# Patient Record
Sex: Female | Born: 1935 | Race: White | Hispanic: No | Marital: Single | Smoking: Former smoker
Health system: Southern US, Community
[De-identification: ages and names within clinical notes are randomized; demographics above are authoritative.]

## PROBLEM LIST (undated history)

## (undated) DIAGNOSIS — K219 Gastro-esophageal reflux disease without esophagitis: Secondary | ICD-10-CM

## (undated) DIAGNOSIS — E7800 Pure hypercholesterolemia, unspecified: Secondary | ICD-10-CM

## (undated) DIAGNOSIS — N289 Disorder of kidney and ureter, unspecified: Secondary | ICD-10-CM

---

## 2006-04-10 ENCOUNTER — Emergency Department: Payer: Self-pay | Admitting: Unknown Physician Specialty

## 2006-08-16 ENCOUNTER — Emergency Department: Payer: Self-pay | Admitting: Emergency Medicine

## 2008-07-07 ENCOUNTER — Emergency Department: Payer: Self-pay | Admitting: Emergency Medicine

## 2008-07-10 ENCOUNTER — Inpatient Hospital Stay: Payer: Self-pay | Admitting: Orthopedic Surgery

## 2008-07-13 ENCOUNTER — Encounter: Payer: Self-pay | Admitting: Internal Medicine

## 2008-07-18 ENCOUNTER — Encounter: Payer: Self-pay | Admitting: Internal Medicine

## 2008-07-24 ENCOUNTER — Emergency Department: Payer: Self-pay | Admitting: Internal Medicine

## 2008-08-04 ENCOUNTER — Emergency Department: Payer: Self-pay | Admitting: Emergency Medicine

## 2008-08-18 ENCOUNTER — Encounter: Payer: Self-pay | Admitting: Internal Medicine

## 2008-09-01 ENCOUNTER — Ambulatory Visit: Payer: Self-pay | Admitting: Internal Medicine

## 2008-09-11 ENCOUNTER — Encounter: Payer: Self-pay | Admitting: Internal Medicine

## 2008-10-12 ENCOUNTER — Encounter: Payer: Self-pay | Admitting: Internal Medicine

## 2008-12-12 ENCOUNTER — Emergency Department: Payer: Self-pay | Admitting: Emergency Medicine

## 2008-12-19 ENCOUNTER — Inpatient Hospital Stay: Payer: Self-pay | Admitting: Internal Medicine

## 2010-12-26 ENCOUNTER — Emergency Department: Payer: Self-pay | Admitting: Unknown Physician Specialty

## 2011-03-28 ENCOUNTER — Emergency Department: Payer: Self-pay | Admitting: Emergency Medicine

## 2011-07-04 ENCOUNTER — Emergency Department: Payer: Self-pay | Admitting: *Deleted

## 2014-11-06 ENCOUNTER — Inpatient Hospital Stay (HOSPITAL_COMMUNITY): Payer: Medicare Other

## 2014-11-06 ENCOUNTER — Emergency Department (HOSPITAL_COMMUNITY): Payer: Medicare Other

## 2014-11-06 ENCOUNTER — Inpatient Hospital Stay (HOSPITAL_COMMUNITY)
Admission: EM | Admit: 2014-11-06 | Discharge: 2014-11-11 | DRG: 640 | Disposition: A | Discharge status: Skilled Nursing Facility | Payer: Medicare Other | Attending: Internal Medicine | Admitting: Internal Medicine

## 2014-11-06 ENCOUNTER — Encounter (HOSPITAL_COMMUNITY): Payer: Self-pay | Admitting: Cardiology

## 2014-11-06 DIAGNOSIS — Z66 Do not resuscitate: Secondary | ICD-10-CM | POA: Diagnosis present

## 2014-11-06 DIAGNOSIS — N2581 Secondary hyperparathyroidism of renal origin: Secondary | ICD-10-CM | POA: Diagnosis present

## 2014-11-06 DIAGNOSIS — R001 Bradycardia, unspecified: Secondary | ICD-10-CM | POA: Diagnosis not present

## 2014-11-06 DIAGNOSIS — D890 Polyclonal hypergammaglobulinemia: Secondary | ICD-10-CM | POA: Diagnosis present

## 2014-11-06 DIAGNOSIS — G309 Alzheimer's disease, unspecified: Secondary | ICD-10-CM | POA: Diagnosis present

## 2014-11-06 DIAGNOSIS — R531 Weakness: Secondary | ICD-10-CM | POA: Diagnosis present

## 2014-11-06 DIAGNOSIS — Z87891 Personal history of nicotine dependence: Secondary | ICD-10-CM | POA: Diagnosis not present

## 2014-11-06 DIAGNOSIS — B9620 Unspecified Escherichia coli [E. coli] as the cause of diseases classified elsewhere: Secondary | ICD-10-CM | POA: Diagnosis present

## 2014-11-06 DIAGNOSIS — E860 Dehydration: Secondary | ICD-10-CM | POA: Diagnosis present

## 2014-11-06 DIAGNOSIS — R1310 Dysphagia, unspecified: Secondary | ICD-10-CM | POA: Diagnosis present

## 2014-11-06 DIAGNOSIS — Z7982 Long term (current) use of aspirin: Secondary | ICD-10-CM

## 2014-11-06 DIAGNOSIS — R109 Unspecified abdominal pain: Secondary | ICD-10-CM

## 2014-11-06 DIAGNOSIS — F0280 Dementia in other diseases classified elsewhere without behavioral disturbance: Secondary | ICD-10-CM | POA: Diagnosis present

## 2014-11-06 DIAGNOSIS — N179 Acute kidney failure, unspecified: Secondary | ICD-10-CM | POA: Diagnosis present

## 2014-11-06 DIAGNOSIS — G9341 Metabolic encephalopathy: Secondary | ICD-10-CM | POA: Diagnosis present

## 2014-11-06 DIAGNOSIS — R7989 Other specified abnormal findings of blood chemistry: Secondary | ICD-10-CM

## 2014-11-06 DIAGNOSIS — R29810 Facial weakness: Secondary | ICD-10-CM | POA: Diagnosis present

## 2014-11-06 DIAGNOSIS — N390 Urinary tract infection, site not specified: Secondary | ICD-10-CM | POA: Diagnosis present

## 2014-11-06 DIAGNOSIS — E876 Hypokalemia: Secondary | ICD-10-CM | POA: Diagnosis not present

## 2014-11-06 DIAGNOSIS — N183 Chronic kidney disease, stage 3 unspecified: Secondary | ICD-10-CM | POA: Diagnosis present

## 2014-11-06 DIAGNOSIS — M8580 Other specified disorders of bone density and structure, unspecified site: Secondary | ICD-10-CM | POA: Diagnosis present

## 2014-11-06 DIAGNOSIS — R7401 Elevation of levels of liver transaminase levels: Secondary | ICD-10-CM | POA: Diagnosis present

## 2014-11-06 DIAGNOSIS — R7303 Prediabetes: Secondary | ICD-10-CM | POA: Diagnosis present

## 2014-11-06 DIAGNOSIS — E861 Hypovolemia: Secondary | ICD-10-CM | POA: Diagnosis present

## 2014-11-06 DIAGNOSIS — H9193 Unspecified hearing loss, bilateral: Secondary | ICD-10-CM | POA: Diagnosis present

## 2014-11-06 DIAGNOSIS — I4891 Unspecified atrial fibrillation: Secondary | ICD-10-CM | POA: Diagnosis not present

## 2014-11-06 DIAGNOSIS — E785 Hyperlipidemia, unspecified: Secondary | ICD-10-CM | POA: Diagnosis present

## 2014-11-06 DIAGNOSIS — I5189 Other ill-defined heart diseases: Secondary | ICD-10-CM | POA: Diagnosis present

## 2014-11-06 DIAGNOSIS — G9340 Encephalopathy, unspecified: Secondary | ICD-10-CM | POA: Diagnosis not present

## 2014-11-06 DIAGNOSIS — I519 Heart disease, unspecified: Secondary | ICD-10-CM | POA: Diagnosis not present

## 2014-11-06 DIAGNOSIS — E780 Pure hypercholesterolemia: Secondary | ICD-10-CM | POA: Diagnosis present

## 2014-11-06 DIAGNOSIS — J309 Allergic rhinitis, unspecified: Secondary | ICD-10-CM | POA: Diagnosis present

## 2014-11-06 DIAGNOSIS — I440 Atrioventricular block, first degree: Secondary | ICD-10-CM | POA: Diagnosis present

## 2014-11-06 DIAGNOSIS — E0781 Sick-euthyroid syndrome: Secondary | ICD-10-CM | POA: Diagnosis present

## 2014-11-06 DIAGNOSIS — E870 Hyperosmolality and hypernatremia: Principal | ICD-10-CM

## 2014-11-06 DIAGNOSIS — F0390 Unspecified dementia without behavioral disturbance: Secondary | ICD-10-CM | POA: Diagnosis present

## 2014-11-06 DIAGNOSIS — R4182 Altered mental status, unspecified: Secondary | ICD-10-CM | POA: Diagnosis not present

## 2014-11-06 DIAGNOSIS — R740 Nonspecific elevation of levels of transaminase and lactic acid dehydrogenase [LDH]: Secondary | ICD-10-CM

## 2014-11-06 DIAGNOSIS — I480 Paroxysmal atrial fibrillation: Secondary | ICD-10-CM | POA: Diagnosis present

## 2014-11-06 DIAGNOSIS — E0590 Thyrotoxicosis, unspecified without thyrotoxic crisis or storm: Secondary | ICD-10-CM | POA: Diagnosis present

## 2014-11-06 DIAGNOSIS — K219 Gastro-esophageal reflux disease without esophagitis: Secondary | ICD-10-CM | POA: Diagnosis present

## 2014-11-06 DIAGNOSIS — B9689 Other specified bacterial agents as the cause of diseases classified elsewhere: Secondary | ICD-10-CM | POA: Diagnosis not present

## 2014-11-06 HISTORY — DX: Gastro-esophageal reflux disease without esophagitis: K21.9

## 2014-11-06 HISTORY — DX: Disorder of kidney and ureter, unspecified: N28.9

## 2014-11-06 HISTORY — DX: Pure hypercholesterolemia, unspecified: E78.00

## 2014-11-06 LAB — LIPID PANEL
Cholesterol: 147 mg/dL (ref 0–200)
HDL: 30 mg/dL — ABNORMAL LOW (ref >39)
LDL CALC: 96 mg/dL (ref 0–99)
TRIGLYCERIDES: 104 mg/dL (ref <150)
Total CHOL/HDL Ratio: 4.9 RATIO
VLDL: 21 mg/dL (ref 0–40)

## 2014-11-06 LAB — URINALYSIS, ROUTINE W REFLEX MICROSCOPIC
Bilirubin Urine: NEGATIVE
Glucose, UA: NEGATIVE mg/dL
KETONES UR: NEGATIVE mg/dL
NITRITE: POSITIVE — AB
Protein, ur: 30 mg/dL — AB
SPECIFIC GRAVITY, URINE: 1.013 (ref 1.005–1.030)
Urobilinogen, UA: 0.2 mg/dL (ref 0.0–1.0)
pH: 5.5 (ref 5.0–8.0)

## 2014-11-06 LAB — MAGNESIUM: Magnesium: 3 mg/dL — ABNORMAL HIGH (ref 1.5–2.5)

## 2014-11-06 LAB — I-STAT CHEM 8, ED
BUN: 57 mg/dL — ABNORMAL HIGH (ref 6–23)
CALCIUM ION: 1.28 mmol/L (ref 1.13–1.30)
Chloride: 131 mmol/L (ref 96–112)
Creatinine, Ser: 1.8 mg/dL — ABNORMAL HIGH (ref 0.50–1.10)
Glucose, Bld: 112 mg/dL — ABNORMAL HIGH (ref 70–99)
HEMATOCRIT: 49 % — AB (ref 36.0–46.0)
Hemoglobin: 16.7 g/dL — ABNORMAL HIGH (ref 12.0–15.0)
Potassium: 3.4 mmol/L — ABNORMAL LOW (ref 3.5–5.1)
Sodium: 177 mmol/L (ref 135–145)
TCO2: 26 mmol/L (ref 0–100)

## 2014-11-06 LAB — DIFFERENTIAL
BASOS PCT: 0 % (ref 0–1)
Basophils Absolute: 0 10*3/uL (ref 0.0–0.1)
Eosinophils Absolute: 0 10*3/uL (ref 0.0–0.7)
Eosinophils Relative: 0 % (ref 0–5)
LYMPHS PCT: 12 % (ref 12–46)
Lymphs Abs: 1.9 10*3/uL (ref 0.7–4.0)
Monocytes Absolute: 1.2 10*3/uL — ABNORMAL HIGH (ref 0.1–1.0)
Monocytes Relative: 8 % (ref 3–12)
Neutro Abs: 12.5 10*3/uL — ABNORMAL HIGH (ref 1.7–7.7)
Neutrophils Relative %: 80 % — ABNORMAL HIGH (ref 43–77)

## 2014-11-06 LAB — TECHNOLOGIST SMEAR REVIEW

## 2014-11-06 LAB — CBG MONITORING, ED: Glucose-Capillary: 116 mg/dL — ABNORMAL HIGH (ref 70–99)

## 2014-11-06 LAB — COMPREHENSIVE METABOLIC PANEL
ALT: 31 U/L (ref 0–35)
AST: 40 U/L — ABNORMAL HIGH (ref 0–37)
Albumin: 3.3 g/dL — ABNORMAL LOW (ref 3.5–5.2)
Alkaline Phosphatase: 70 U/L (ref 39–117)
BILIRUBIN TOTAL: 1.1 mg/dL (ref 0.3–1.2)
BUN: 59 mg/dL — AB (ref 6–23)
CO2: 29 mmol/L (ref 19–32)
Calcium: 10.4 mg/dL (ref 8.4–10.5)
Chloride: >130 mmol/L (ref 96–112)
Creatinine, Ser: 1.91 mg/dL — ABNORMAL HIGH (ref 0.50–1.10)
GFR calc Af Amer: 26 mL/min — ABNORMAL LOW (ref >90)
GFR calc non Af Amer: 23 mL/min — ABNORMAL LOW (ref >90)
Glucose, Bld: 116 mg/dL — ABNORMAL HIGH (ref 70–99)
Potassium: 3.4 mmol/L — ABNORMAL LOW (ref 3.5–5.1)
Sodium: 174 mmol/L (ref 135–145)
TOTAL PROTEIN: 7.8 g/dL (ref 6.0–8.3)

## 2014-11-06 LAB — HEPATITIS PANEL, ACUTE
HCV Ab: NEGATIVE
HEP A IGM: NONREACTIVE
HEP B S AG: NEGATIVE
Hep B C IgM: NONREACTIVE

## 2014-11-06 LAB — BASIC METABOLIC PANEL
BUN: 57 mg/dL — AB (ref 6–23)
CO2: 28 mmol/L (ref 19–32)
Calcium: 9.8 mg/dL (ref 8.4–10.5)
Chloride: >130 mmol/L (ref 96–112)
Creatinine, Ser: 1.67 mg/dL — ABNORMAL HIGH (ref 0.50–1.10)
GFR calc Af Amer: 31 mL/min — ABNORMAL LOW (ref >90)
GFR calc non Af Amer: 27 mL/min — ABNORMAL LOW (ref >90)
Glucose, Bld: 116 mg/dL — ABNORMAL HIGH (ref 70–99)
Potassium: 3.3 mmol/L — ABNORMAL LOW (ref 3.5–5.1)
Sodium: 173 mmol/L (ref 135–145)

## 2014-11-06 LAB — RAPID URINE DRUG SCREEN, HOSP PERFORMED
Amphetamines: NOT DETECTED
Barbiturates: NOT DETECTED
Benzodiazepines: NOT DETECTED
COCAINE: NOT DETECTED
OPIATES: POSITIVE — AB
TETRAHYDROCANNABINOL: NOT DETECTED

## 2014-11-06 LAB — APTT: aPTT: 39 seconds — ABNORMAL HIGH (ref 24–37)

## 2014-11-06 LAB — SODIUM, URINE, RANDOM: SODIUM UR: 47 mmol/L

## 2014-11-06 LAB — CBC
HCT: 49.8 % — ABNORMAL HIGH (ref 36.0–46.0)
Hemoglobin: 15.3 g/dL — ABNORMAL HIGH (ref 12.0–15.0)
MCH: 32.5 pg (ref 26.0–34.0)
MCHC: 30.7 g/dL (ref 30.0–36.0)
MCV: 105.7 fL — AB (ref 78.0–100.0)
PLATELETS: 224 10*3/uL (ref 150–400)
RBC: 4.71 MIL/uL (ref 3.87–5.11)
RDW: 13.8 % (ref 11.5–15.5)
WBC: 15.6 10*3/uL — ABNORMAL HIGH (ref 4.0–10.5)

## 2014-11-06 LAB — TSH: TSH: 0.168 u[IU]/mL — AB (ref 0.350–4.500)

## 2014-11-06 LAB — URINE MICROSCOPIC-ADD ON

## 2014-11-06 LAB — PROTIME-INR
INR: 1.18 (ref 0.00–1.49)
Prothrombin Time: 15.1 seconds (ref 11.6–15.2)

## 2014-11-06 LAB — TROPONIN I
Troponin I: 0.06 ng/mL — ABNORMAL HIGH (ref <0.031)
Troponin I: 0.07 ng/mL — ABNORMAL HIGH (ref <0.031)

## 2014-11-06 LAB — I-STAT TROPONIN, ED: Troponin i, poc: 0.04 ng/mL (ref 0.00–0.08)

## 2014-11-06 LAB — CREATININE, URINE, RANDOM: CREATININE, URINE: 79.13 mg/dL

## 2014-11-06 LAB — ETHANOL

## 2014-11-06 LAB — OSMOLALITY, URINE: Osmolality, Ur: 460 mOsm/kg (ref 390–1090)

## 2014-11-06 LAB — MRSA PCR SCREENING: MRSA by PCR: NEGATIVE

## 2014-11-06 LAB — PHOSPHORUS: Phosphorus: 2.8 mg/dL (ref 2.3–4.6)

## 2014-11-06 LAB — CK: CK TOTAL: 55 U/L (ref 7–177)

## 2014-11-06 MED ORDER — SODIUM CHLORIDE 0.9 % IV SOLN: INTRAVENOUS | Status: DC

## 2014-11-06 MED ORDER — DEXTROSE 5 % IV SOLN
1.0000 g | Freq: Once | INTRAVENOUS | Status: AC
  Administered 2014-11-06: 1 g via INTRAVENOUS
  Filled 2014-11-06: qty 10

## 2014-11-06 MED ORDER — HEPARIN BOLUS VIA INFUSION
3000.0000 [IU] | Freq: Once | INTRAVENOUS | Status: AC
  Administered 2014-11-06: 3000 [IU] via INTRAVENOUS
  Filled 2014-11-06: qty 3000

## 2014-11-06 MED ORDER — SODIUM CHLORIDE 0.9 % IJ SOLN
3.0000 mL | Freq: Two times a day (BID) | INTRAMUSCULAR | Status: DC
  Administered 2014-11-07 – 2014-11-10 (×5): 3 mL via INTRAVENOUS

## 2014-11-06 MED ORDER — HEPARIN (PORCINE) IN NACL 100-0.45 UNIT/ML-% IJ SOLN
650.0000 [IU]/h | INTRAMUSCULAR | Status: DC
  Administered 2014-11-06: 650 [IU]/h via INTRAVENOUS
  Filled 2014-11-06: qty 250

## 2014-11-06 MED ORDER — ONDANSETRON HCL 4 MG/2ML IJ SOLN: 4.0000 mg | Freq: Four times a day (QID) | INTRAMUSCULAR | Status: DC | PRN

## 2014-11-06 MED ORDER — DEXTROSE 5 % IV SOLN
1.0000 g | INTRAVENOUS | Status: DC
  Administered 2014-11-07 – 2014-11-08 (×2): 1 g via INTRAVENOUS
  Filled 2014-11-06 (×3): qty 10

## 2014-11-06 MED ORDER — POTASSIUM CHLORIDE 10 MEQ/100ML IV SOLN
10.0000 meq | INTRAVENOUS | Status: AC
  Administered 2014-11-06: 10 meq via INTRAVENOUS
  Filled 2014-11-06: qty 100

## 2014-11-06 MED ORDER — ONDANSETRON HCL 4 MG PO TABS: 4.0000 mg | ORAL_TABLET | Freq: Four times a day (QID) | ORAL | Status: DC | PRN

## 2014-11-06 MED ORDER — CETYLPYRIDINIUM CHLORIDE 0.05 % MT LIQD
7.0000 mL | Freq: Two times a day (BID) | OROMUCOSAL | Status: DC
  Administered 2014-11-06: 7 mL via OROMUCOSAL

## 2014-11-06 MED ORDER — DILTIAZEM HCL 100 MG IV SOLR: 5.0000 mg/h | Freq: Once | INTRAVENOUS | Status: DC

## 2014-11-06 MED ORDER — POTASSIUM CHLORIDE 10 MEQ/100ML IV SOLN
10.0000 meq | INTRAVENOUS | Status: AC
  Administered 2014-11-06 (×2): 10 meq via INTRAVENOUS
  Filled 2014-11-06 (×2): qty 100

## 2014-11-06 NOTE — ED Notes (Signed)
Pt to department via EMS from home place of [place]. Pt was awakened and taken by staff to the dining room for breakfast, pt was unable to swallow her food. Pt's baseline is normally confused but verbal. Today she is not talking and not acting like herself.  LSN- unknown. CBG-116 Bp-158 Hr-70 20g LAC.

## 2014-11-06 NOTE — Consult Note (Signed)
Admission H&P    Chief Complaint: Altered mental status.  HPI: Molly Jimenez is an 81 y.o. female with a history of renal disorder, atrial fibrillation not on anticoagulation, GERD, hypercholesterolemia and dementia, brought to the emergency room for evaluation of altered mental status. Patient reportedly has become progressively more confused over the past 1 week. She was noted today to be staring and not responding to the caretaker that was feeding her. There was also an equivocal right facial droop. CT scan of her head showed progressive brain atrophy and chronic small vessel disease, but no acute changes. She had a low-grade fever of 99.1. Serum sodium was elevated at 177, BUN was 57 and creatinine was 1.8. Urinalysis showed findings indicative of acute urinary tract infection. WBC count was 15.6. Neurology consultation was obtained because of altered mental status and possible acute stroke in the setting of atrial fibrillation. Anticoagulation with IV heparin was started.  Past Medical History  Diagnosis Date  . Renal disorder   . GERD (gastroesophageal reflux disease)   . Hypercholesteremia     History reviewed. No pertinent past surgical history.  Family history: Unavailable due to patient's mental status.  Social History:  reports that she has quit smoking. She does not have any smokeless tobacco history on file. She reports that she does not drink alcohol. Her drug history is not on file.  Allergies: No Known Allergies  Medications: Patient's preadmission medications were reviewed by me.  ROS: Unobtainable due to patient's mental status.  Physical Examination: Blood pressure 113/64, pulse 36, temperature 99.1 F (37.3 C), temperature source Rectal, resp. rate 19, SpO2 94 %.  HEENT-  Normocephalic, no lesions, without obvious abnormality.  Normal external eye and conjunctiva.  Normal TM's bilaterally.  Normal auditory canals and external ears. Normal external nose, mucus  membranes and septum.  Normal pharynx. Neck supple with no masses, nodes, nodules or enlargement. Cardiovascular - regular rate and rhythm, S1, S2 normal, no murmur, click, rub or gallop Lungs - chest clear, no wheezing, rales, normal symmetric air entry, Heart exam - S1, S2 normal, no murmur, no gallop, rate regular Abdomen - soft, non-tender; bowel sounds normal; no masses,  no organomegaly Extremities - no joint deformities, effusion, or inflammation and no edema  Neurologic Examination: Patient appeared to be alert but did not respond verbally and did not follow simple commands. She was able to visually track to both sides without difficulty. Pupils were equal and reacted normally to light. Extraocular movements were full and conjugate with right left lateral gaze. Mild facial asymmetry noted with slight right lower facial droop. Patient had no speech output. Muscle tone was increased throughout, right greater than left. Resting of right upper extremity and shoulder was noted. Tendon reflexes are asymmetric with increased responses elicited from left extremities compared to right. Plantar response on the right was mute and on the left.   Results for orders placed or performed during the hospital encounter of 11/06/14 (from the past 48 hour(s))  Urine Drug Screen     Status: Abnormal   Collection Time: 11/06/14 10:54 AM  Result Value Ref Range   Opiates POSITIVE (A) NONE DETECTED   Cocaine NONE DETECTED NONE DETECTED   Benzodiazepines NONE DETECTED NONE DETECTED   Amphetamines NONE DETECTED NONE DETECTED   Tetrahydrocannabinol NONE DETECTED NONE DETECTED   Barbiturates NONE DETECTED NONE DETECTED    Comment:        DRUG SCREEN FOR MEDICAL PURPOSES ONLY.  IF CONFIRMATION IS NEEDED FOR  ANY PURPOSE, NOTIFY LAB WITHIN 5 DAYS.        LOWEST DETECTABLE LIMITS FOR URINE DRUG SCREEN Drug Class       Cutoff (ng/mL) Amphetamine      1000 Barbiturate      200 Benzodiazepine    315 Tricyclics       176 Opiates          300 Cocaine          300 THC              50   Urinalysis, Routine w reflex microscopic     Status: Abnormal   Collection Time: 11/06/14 10:54 AM  Result Value Ref Range   Color, Urine YELLOW YELLOW   APPearance CLOUDY (A) CLEAR   Specific Gravity, Urine 1.013 1.005 - 1.030   pH 5.5 5.0 - 8.0   Glucose, UA NEGATIVE NEGATIVE mg/dL   Hgb urine dipstick SMALL (A) NEGATIVE   Bilirubin Urine NEGATIVE NEGATIVE   Ketones, ur NEGATIVE NEGATIVE mg/dL   Protein, ur 30 (A) NEGATIVE mg/dL   Urobilinogen, UA 0.2 0.0 - 1.0 mg/dL   Nitrite POSITIVE (A) NEGATIVE   Leukocytes, UA LARGE (A) NEGATIVE  Urine microscopic-add on     Status: Abnormal   Collection Time: 11/06/14 10:54 AM  Result Value Ref Range   WBC, UA 11-20 <3 WBC/hpf   RBC / HPF 0-2 <3 RBC/hpf   Bacteria, UA MANY (A) RARE  Sodium, urine, random     Status: None   Collection Time: 11/06/14 10:54 AM  Result Value Ref Range   Sodium, Ur 47 mmol/L  Creatinine, urine, random     Status: None   Collection Time: 11/06/14 10:54 AM  Result Value Ref Range   Creatinine, Urine 79.13 mg/dL  CBG monitoring, ED     Status: Abnormal   Collection Time: 11/06/14 11:00 AM  Result Value Ref Range   Glucose-Capillary 116 (H) 70 - 99 mg/dL  Ethanol     Status: None   Collection Time: 11/06/14 11:02 AM  Result Value Ref Range   Alcohol, Ethyl (B) <5 0 - 9 mg/dL    Comment:        LOWEST DETECTABLE LIMIT FOR SERUM ALCOHOL IS 11 mg/dL FOR MEDICAL PURPOSES ONLY   Protime-INR     Status: None   Collection Time: 11/06/14 11:02 AM  Result Value Ref Range   Prothrombin Time 15.1 11.6 - 15.2 seconds   INR 1.18 0.00 - 1.49  APTT     Status: Abnormal   Collection Time: 11/06/14 11:02 AM  Result Value Ref Range   aPTT 39 (H) 24 - 37 seconds    Comment:        IF BASELINE aPTT IS ELEVATED, SUGGEST PATIENT RISK ASSESSMENT BE USED TO DETERMINE APPROPRIATE ANTICOAGULANT THERAPY.   CBC     Status:  Abnormal   Collection Time: 11/06/14 11:02 AM  Result Value Ref Range   WBC 15.6 (H) 4.0 - 10.5 K/uL   RBC 4.71 3.87 - 5.11 MIL/uL   Hemoglobin 15.3 (H) 12.0 - 15.0 g/dL   HCT 49.8 (H) 36.0 - 46.0 %   MCV 105.7 (H) 78.0 - 100.0 fL   MCH 32.5 26.0 - 34.0 pg   MCHC 30.7 30.0 - 36.0 g/dL   RDW 13.8 11.5 - 15.5 %   Platelets 224 150 - 400 K/uL  Differential     Status: Abnormal   Collection Time: 11/06/14 11:02 AM  Result Value  Ref Range   Neutrophils Relative % 80 (H) 43 - 77 %   Neutro Abs 12.5 (H) 1.7 - 7.7 K/uL   Lymphocytes Relative 12 12 - 46 %   Lymphs Abs 1.9 0.7 - 4.0 K/uL   Monocytes Relative 8 3 - 12 %   Monocytes Absolute 1.2 (H) 0.1 - 1.0 K/uL   Eosinophils Relative 0 0 - 5 %   Eosinophils Absolute 0.0 0.0 - 0.7 K/uL   Basophils Relative 0 0 - 1 %   Basophils Absolute 0.0 0.0 - 0.1 K/uL  Comprehensive metabolic panel     Status: Abnormal   Collection Time: 11/06/14 11:02 AM  Result Value Ref Range   Sodium 174 (HH) 135 - 145 mmol/L    Comment: REPEATED TO VERIFY CRITICAL RESULT CALLED TO, READ BACK BY AND VERIFIED WITH: L.ROBERTS,RN 11/06/14 1204 BY BSLADE    Potassium 3.4 (L) 3.5 - 5.1 mmol/L   Chloride >130 (HH) 96 - 112 mmol/L    Comment: REPEATED TO VERIFY CRITICAL RESULT CALLED TO, READ BACK BY AND VERIFIED WITH: L.ROBERTS,RN 11/06/14 1203 BY BSLADE    CO2 29 19 - 32 mmol/L   Glucose, Bld 116 (H) 70 - 99 mg/dL   BUN 59 (H) 6 - 23 mg/dL   Creatinine, Ser 1.91 (H) 0.50 - 1.10 mg/dL   Calcium 10.4 8.4 - 10.5 mg/dL   Total Protein 7.8 6.0 - 8.3 g/dL   Albumin 3.3 (L) 3.5 - 5.2 g/dL   AST 40 (H) 0 - 37 U/L   ALT 31 0 - 35 U/L   Alkaline Phosphatase 70 39 - 117 U/L   Total Bilirubin 1.1 0.3 - 1.2 mg/dL   GFR calc non Af Amer 23 (L) >90 mL/min   GFR calc Af Amer 26 (L) >90 mL/min    Comment: (NOTE) The eGFR has been calculated using the CKD EPI equation. This calculation has not been validated in all clinical situations. eGFR's persistently <90 mL/min  signify possible Chronic Kidney Disease.    Anion gap NOT CALCULATED 5 - 15  I-stat troponin, ED (not at [facility], [facility])     Status: None   Collection Time: 11/06/14 11:16 AM  Result Value Ref Range   Troponin i, poc 0.04 0.00 - 0.08 ng/mL   Comment 3            Comment: Due to the release kinetics of cTnI, a negative result within the first hours of the onset of symptoms does not rule out myocardial infarction with certainty. If myocardial infarction is still suspected, repeat the test at appropriate intervals.   I-Stat Chem 8, ED     Status: Abnormal   Collection Time: 11/06/14 11:18 AM  Result Value Ref Range   Sodium 177 (HH) 135 - 145 mmol/L   Potassium 3.4 (L) 3.5 - 5.1 mmol/L   Chloride 131 (HH) 96 - 112 mmol/L   BUN 57 (H) 6 - 23 mg/dL   Creatinine, Ser 1.80 (H) 0.50 - 1.10 mg/dL   Glucose, Bld 112 (H) 70 - 99 mg/dL   Calcium, Ion 1.28 1.13 - 1.30 mmol/L   TCO2 26 0 - 100 mmol/L   Hemoglobin 16.7 (H) 12.0 - 15.0 g/dL   HCT 49.0 (H) 36.0 - 46.0 %   Comment NOTIFIED PHYSICIAN    Ct Head Wo Contrast  11/06/2014   CLINICAL DATA:  Unable to eat. Right-sided neck pain when trying to straighten.  EXAM: CT HEAD WITHOUT CONTRAST  CT CERVICAL SPINE  WITHOUT CONTRAST  TECHNIQUE: Multidetector CT imaging of the head and cervical spine was performed following the standard protocol without intravenous contrast. Multiplanar CT image reconstructions of the cervical spine were also generated.  COMPARISON:  Head CT 07/04/2011  FINDINGS: CT HEAD FINDINGS  Skull and Sinuses:Negative for fracture or destructive process. The mastoids, middle ears, and imaged paranasal sinuses are clear.  Orbits: No acute abnormality.  Brain: No evidence of acute infarction, hemorrhage, hydrocephalus, or mass lesion/mass effect. Generalized brain atrophy with ventriculomegaly. Brain atrophy which is progressed from 2012. Ventriculomegaly is likely from central predominant volume loss. There is prominent mesial temporal  lobe atrophy, pattern which can be seen with Alzheimer's disease. Chronic small vessel disease ischemic gliosis throughout the bilateral cerebral white matter, also progressed from prior.  CT CERVICAL SPINE FINDINGS  No fracture or subluxation. No erosion of the endplates with facets. No gross cervical canal hematoma or prevertebral edema. The muscular or ligamentous ossification.  Profound osteopenia. There is degenerative disc disease which is focally advanced at C5-6. Despite endplate spurs node notable canal stenosis. Facet ankylosis bilaterally at C4-5 and C5-6. There is advanced degeneration at the C1-C2 articulation with bulky spurring about the atlanto dental interval.  Bilateral submandibular sialolithiasis.  No acute inflammation.  Aberrant right subclavian artery, partly visualized.  IMPRESSION: 1. No acute intracranial or cervical spine findings. 2. Progressive brain atrophy and chronic small vessel disease.   Electronically Signed   By: Monte Fantasia M.D.   On: 11/06/2014 12:01   Ct Cervical Spine Wo Contrast  11/06/2014   CLINICAL DATA:  Unable to eat. Right-sided neck pain when trying to straighten.  EXAM: CT HEAD WITHOUT CONTRAST  CT CERVICAL SPINE WITHOUT CONTRAST  TECHNIQUE: Multidetector CT imaging of the head and cervical spine was performed following the standard protocol without intravenous contrast. Multiplanar CT image reconstructions of the cervical spine were also generated.  COMPARISON:  Head CT 07/04/2011  FINDINGS: CT HEAD FINDINGS  Skull and Sinuses:Negative for fracture or destructive process. The mastoids, middle ears, and imaged paranasal sinuses are clear.  Orbits: No acute abnormality.  Brain: No evidence of acute infarction, hemorrhage, hydrocephalus, or mass lesion/mass effect. Generalized brain atrophy with ventriculomegaly. Brain atrophy which is progressed from 2012. Ventriculomegaly is likely from central predominant volume loss. There is prominent mesial temporal lobe  atrophy, pattern which can be seen with Alzheimer's disease. Chronic small vessel disease ischemic gliosis throughout the bilateral cerebral white matter, also progressed from prior.  CT CERVICAL SPINE FINDINGS  No fracture or subluxation. No erosion of the endplates with facets. No gross cervical canal hematoma or prevertebral edema. The muscular or ligamentous ossification.  Profound osteopenia. There is degenerative disc disease which is focally advanced at C5-6. Despite endplate spurs node notable canal stenosis. Facet ankylosis bilaterally at C4-5 and C5-6. There is advanced degeneration at the C1-C2 articulation with bulky spurring about the atlanto dental interval.  Bilateral submandibular sialolithiasis.  No acute inflammation.  Aberrant right subclavian artery, partly visualized.  IMPRESSION: 1. No acute intracranial or cervical spine findings. 2. Progressive brain atrophy and chronic small vessel disease.   Electronically Signed   By: Monte Fantasia M.D.   On: 11/06/2014 12:01   Dg Chest Portable 1 View  11/06/2014   CLINICAL DATA:  Weakness and dysphagia.  Left facial droop.  EXAM: PORTABLE CHEST - 1 VIEW  COMPARISON:  PA and lateral chest 07/04/2011 and 08/04/2008.  FINDINGS: The lungs are clear. Heart size is normal. No pneumothorax or pleural  effusion. Vertebral augmentation lower thoracic spine is again seen. Degenerative disease about the shoulders is worse on the right.  IMPRESSION: No acute disease.   Electronically Signed   By: Inge Rise M.D.   On: 11/06/2014 11:07    Assessment/Plan 81 year old lady presenting with encephalopathic state, most likely secondary to multiple factors, including underlying dementia, hypernatremia, dehydration, urinary tract infection and possible acute stroke.  Recommendations: 1. MRI of the brain to rule out acute stroke. 2. Stroke workup with risk assessment if MRI shows acute stroke, including hemoglobin A1c, fasting lipid panel, carotid Doppler,  2-D echocardiogram and MRA. 3. Agree with anticoagulation plans.  We will continue to follow this patient with you.  C.R. Nicole Kindred, [facility] [phone]  11/06/2014, 2:28 PM

## 2014-11-06 NOTE — ED Notes (Signed)
[facility] [place].  Sts pt is typically non-verbal except "no" or "don't do that".  Sts pt is typically able to stand and pivot in to a wheelchair for movement.

## 2014-11-06 NOTE — Progress Notes (Signed)
ANTICOAGULATION CONSULT NOTE - Initial Consult  Pharmacy Consult for Heparin Indication: atrial fibrillation  No Known Allergies  Patient Measurements:   Ht: 65 in   Wt: 53 kg  Vital Signs: Temp: 99.1 F (37.3 C) (04/25 1054) Temp Source: Rectal (04/25 1054) BP: 120/61 mmHg (04/25 1300) Pulse Rate: 71 (04/25 1300)  Labs:  Recent Labs  11/06/14 1102 11/06/14 1118  HGB 15.3* 16.7*  HCT 49.8* 49.0*  PLT 224  --   APTT 39*  --   LABPROT 15.1  --   INR 1.18  --   CREATININE 1.91* 1.80*    CrCl cannot be calculated (Unknown ideal weight.).   Medical History: Past Medical History  Diagnosis Date  . Renal disorder   . GERD (gastroesophageal reflux disease)   . Hypercholesteremia     Medications:  See electronic med rec  Assessment: 81 y.o. female presents with weakness and dysphagia. Found to be in afib. To begin heparin. CBC stable at baseline. SCr 1.8, est CrCl 18 ml/min.  Goal of Therapy:  Heparin level 0.3-0.7 units/ml Monitor platelets by anticoagulation protocol: Yes   Plan:  Heparin IV bolus 3000 units Heparin gtt at 650 units/hr Will f/u 8 hr heparin level Daily heparin level and CBC  Christoper Fabianaron Antonino Nienhuis, PharmD, BCPS Clinical pharmacist, pager [phone] 11/06/2014,1:24 PM

## 2014-11-06 NOTE — ED Provider Notes (Signed)
CSN: [id number]     Arrival date & time    History   First MD Initiated Contact with Patient 11/06/14 [phone]     Chief Complaint  Patient presents with  . Weakness  . Dysphagia   LEVEL 5 CAVEAT DUE TO ALTERED MENTAL STATUS Patient is a 81 y.o. female presenting with weakness. The history is provided by the patient.  Weakness This is a new problem. Episode onset: UNKNOWN. The problem occurs constantly. The problem has been gradually worsening. Nothing aggravates the symptoms. Nothing relieves the symptoms.  Patient presents from assisted living facility for altered mental status Per Victorino Dike, Interior and spatial designer of nursing at facility, pt woke up with confusion, she was nonverbal (new) and left facial droop.  It is unclear when this started.  Pt had increasing confusion over a week ago but this is worse No falls reported No other details are known She has no family locally but has Child psychotherapist guardian She is a full code   Past Medical History  Diagnosis Date  . Renal disorder   . GERD (gastroesophageal reflux disease)   . Hypercholesteremia    History reviewed. No pertinent past surgical history. History reviewed. No pertinent family history. History  Substance Use Topics  . Smoking status: Former Games developer  . Smokeless tobacco: Not on file  . Alcohol Use: No   OB History    No data available     Review of Systems  Unable to perform ROS: Mental status change  Neurological: Positive for weakness.      Allergies  Review of patient's allergies indicates no known allergies.  Home Medications   Prior to Admission medications   Not on File   BP 126/93 mmHg  Pulse 86  Temp(Src) 98.6 F (37 C) (Oral)  Resp 18  SpO2 95% Physical Exam CONSTITUTIONAL: elderly, frail HEAD: Normocephalic/atraumatic EYES: EOMI ENMT: Mucous membranes moist NECK: supple no meningeal signs SPINE/BACK:cervical spine tenderness, No bruising/crepitance/stepoffs noted to spine CV: S1/S2 noted, no loud  murmurs LUNGS: Lungs are clear to auscultation bilaterally, no apparent distress ABDOMEN: soft, nontender NEURO: Pt is awake/alert.  She is nonverbal.  She will not follow commands.  She will intermittently move her extremities.   EXTREMITIES: pulses normal/equal, full ROM, no deformities noted SKIN: warm, color normal PSYCH: unable to assess  ED Course  Procedures  CRITICAL CARE Performed by: Joya Gaskins Total critical care time: 35 Critical care time was exclusive of separately billable procedures and treating other patients. Critical care was necessary to treat or prevent imminent or life-threatening deterioration. Critical care was time spent personally by me on the following activities: development of treatment plan with patient and/or surrogate as well as nursing, discussions with consultants, evaluation of patient's response to treatment, examination of patient, obtaining history from patient or surrogate, ordering and performing treatments and interventions, ordering and review of laboratory studies, orderiNg and review of radiographic studies, pulse oximetry and re-evaluation of patient's condition. PATIENT WITH ATRIAL FIBRILLATION REQUIRING CARDIZEM AND ALSO SIGNIFICANT HYPERNATREMIA WITH ALTERED MENTAL STATUS  10:29 AM Pt with altered mental status with unclear time of onset Reports left facial droop but I am unable to get her to follow commands to test for this Workup initated She does appear to grimace when palpating cervical spine, CT cspine ordered in addition to CT head tPA in stroke considered but not given due to: Onset over 3-4.5hours 11:52 AM UTI noted Significant hypernatremia noted Will need admission 12:04 PM Pt now appears to be tachycardic Suspect atrial  fibrillation 12:40 PM D/w outpatient clinics Will admit to stepdown cardizem started for atrial fibrillation   EKG Interpretation  Date/Time:  Monday November 06 2014 11:53:58 EDT Ventricular Rate:   144 PR Interval:  118 QRS Duration: 66 QT Interval:  285 QTC Calculation: 441 R Axis:   -47 Text Interpretation:  Artifact suspect atrial fibrillation Abnormal ekg changed from prior Left axis deviation Low voltage, extremity and precordial leads Repolarization abnormality, prob rate related Confirmed by Bebe Shaggy  MD, Dorinda Hill ([id number]) on 11/06/2014 12:03:56 PM        Labs Review Labs Reviewed  CBC - Abnormal; Notable for the following:    WBC 15.6 (*)    Hemoglobin 15.3 (*)    HCT 49.8 (*)    MCV 105.7 (*)    All other components within normal limits  DIFFERENTIAL - Abnormal; Notable for the following:    Neutrophils Relative % 80 (*)    Neutro Abs 12.5 (*)    Monocytes Absolute 1.2 (*)    All other components within normal limits  URINE RAPID DRUG SCREEN (HOSP PERFORMED) - Abnormal; Notable for the following:    Opiates POSITIVE (*)    All other components within normal limits  URINALYSIS, ROUTINE W REFLEX MICROSCOPIC - Abnormal; Notable for the following:    APPearance CLOUDY (*)    Hgb urine dipstick SMALL (*)    Protein, ur 30 (*)    Nitrite POSITIVE (*)    Leukocytes, UA LARGE (*)    All other components within normal limits  URINE MICROSCOPIC-ADD ON - Abnormal; Notable for the following:    Bacteria, UA MANY (*)    All other components within normal limits  I-STAT CHEM 8, ED - Abnormal; Notable for the following:    Sodium 177 (*)    Potassium 3.4 (*)    Chloride 131 (*)    BUN 57 (*)    Creatinine, Ser 1.80 (*)    Glucose, Bld 112 (*)    Hemoglobin 16.7 (*)    HCT 49.0 (*)    All other components within normal limits  CBG MONITORING, ED - Abnormal; Notable for the following:    Glucose-Capillary 116 (*)    All other components within normal limits  ETHANOL  PROTIME-INR  APTT  COMPREHENSIVE METABOLIC PANEL  I-STAT TROPOININ, ED    Imaging Review Ct Head Wo Contrast  11/06/2014   CLINICAL DATA:  Unable to eat. Right-sided neck pain when trying to  straighten.  EXAM: CT HEAD WITHOUT CONTRAST  CT CERVICAL SPINE WITHOUT CONTRAST  TECHNIQUE: Multidetector CT imaging of the head and cervical spine was performed following the standard protocol without intravenous contrast. Multiplanar CT image reconstructions of the cervical spine were also generated.  COMPARISON:  Head CT 07/04/2011  FINDINGS: CT HEAD FINDINGS  Skull and Sinuses:Negative for fracture or destructive process. The mastoids, middle ears, and imaged paranasal sinuses are clear.  Orbits: No acute abnormality.  Brain: No evidence of acute infarction, hemorrhage, hydrocephalus, or mass lesion/mass effect. Generalized brain atrophy with ventriculomegaly. Brain atrophy which is progressed from 2012. Ventriculomegaly is likely from central predominant volume loss. There is prominent mesial temporal lobe atrophy, pattern which can be seen with Alzheimer's disease. Chronic small vessel disease ischemic gliosis throughout the bilateral cerebral white matter, also progressed from prior.  CT CERVICAL SPINE FINDINGS  No fracture or subluxation. No erosion of the endplates with facets. No gross cervical canal hematoma or prevertebral edema. The muscular or ligamentous ossification.  Profound osteopenia.  There is degenerative disc disease which is focally advanced at C5-6. Despite endplate spurs node notable canal stenosis. Facet ankylosis bilaterally at C4-5 and C5-6. There is advanced degeneration at the C1-C2 articulation with bulky spurring about the atlanto dental interval.  Bilateral submandibular sialolithiasis.  No acute inflammation.  Aberrant right subclavian artery, partly visualized.  IMPRESSION: 1. No acute intracranial or cervical spine findings. 2. Progressive brain atrophy and chronic small vessel disease.   Electronically Signed   By: Marnee Spring M.D.   On: 11/06/2014 12:01   Ct Cervical Spine Wo Contrast  11/06/2014   CLINICAL DATA:  Unable to eat. Right-sided neck pain when trying to  straighten.  EXAM: CT HEAD WITHOUT CONTRAST  CT CERVICAL SPINE WITHOUT CONTRAST  TECHNIQUE: Multidetector CT imaging of the head and cervical spine was performed following the standard protocol without intravenous contrast. Multiplanar CT image reconstructions of the cervical spine were also generated.  COMPARISON:  Head CT 07/04/2011  FINDINGS: CT HEAD FINDINGS  Skull and Sinuses:Negative for fracture or destructive process. The mastoids, middle ears, and imaged paranasal sinuses are clear.  Orbits: No acute abnormality.  Brain: No evidence of acute infarction, hemorrhage, hydrocephalus, or mass lesion/mass effect. Generalized brain atrophy with ventriculomegaly. Brain atrophy which is progressed from 2012. Ventriculomegaly is likely from central predominant volume loss. There is prominent mesial temporal lobe atrophy, pattern which can be seen with Alzheimer's disease. Chronic small vessel disease ischemic gliosis throughout the bilateral cerebral white matter, also progressed from prior.  CT CERVICAL SPINE FINDINGS  No fracture or subluxation. No erosion of the endplates with facets. No gross cervical canal hematoma or prevertebral edema. The muscular or ligamentous ossification.  Profound osteopenia. There is degenerative disc disease which is focally advanced at C5-6. Despite endplate spurs node notable canal stenosis. Facet ankylosis bilaterally at C4-5 and C5-6. There is advanced degeneration at the C1-C2 articulation with bulky spurring about the atlanto dental interval.  Bilateral submandibular sialolithiasis.  No acute inflammation.  Aberrant right subclavian artery, partly visualized.  IMPRESSION: 1. No acute intracranial or cervical spine findings. 2. Progressive brain atrophy and chronic small vessel disease.   Electronically Signed   By: Marnee Spring M.D.   On: 11/06/2014 12:01   Dg Chest Portable 1 View  11/06/2014   CLINICAL DATA:  Weakness and dysphagia.  Left facial droop.  EXAM: PORTABLE  CHEST - 1 VIEW  COMPARISON:  PA and lateral chest 07/04/2011 and 08/04/2008.  FINDINGS: The lungs are clear. Heart size is normal. No pneumothorax or pleural effusion. Vertebral augmentation lower thoracic spine is again seen. Degenerative disease about the shoulders is worse on the right.  IMPRESSION: No acute disease.   Electronically Signed   By: Drusilla Kanner M.D.   On: 11/06/2014 11:07     EKG Interpretation   Date/Time:  Monday November 06 2014 09:56:41 EDT Ventricular Rate:  83 PR Interval:  110 QRS Duration: 73 QT Interval:  474 QTC Calculation: 557 R Axis:   -7 Text Interpretation:  rhythm indeterminate Atrial premature complexes  Borderline short PR interval Low voltage, extremity leads Nonspecific  repol abnormality, diffuse leads Prolonged QT interval No previous ECGs  available Abnormal ekg artifact noted Confirmed by Bebe Shaggy  MD, Ernesta Trabert  [phone]) on 11/06/2014 10:19:10 AM     Medications  cefTRIAXone (ROCEPHIN) 1 g in dextrose 5 % 50 mL IVPB (0 g Intravenous Stopped 11/06/14 1230)  diltiazem (CARDIZEM) 100 mg in dextrose 5 % 100 mL (1 mg/mL) infusion (5 mg/hr  Intravenous New Bag/Given 11/06/14 1229)    MDM   Final diagnoses:  UTI (lower urinary tract infection)  Metabolic encephalopathy  Hypernatremia  AKI (acute kidney injury)  Atrial fibrillation with rapid ventricular response    Nursing notes including past medical history and social history reviewed and considered in documentation Labs/vital reviewed myself and considered during evaluation xrays/imaging reviewed by myself and considered during evaluation     Zadie Rhineonald Janeen Watson, MD 11/06/14 1241

## 2014-11-06 NOTE — H&P (Signed)
Date: 11/06/2014               Patient Name:  Molly Jimenez MRN: [id number]  DOB: Aug 25, 1925 Age / Sex: 81 y.o., female   PCP: No primary care provider on file.         Medical Service: Internal Medicine Teaching Service         Attending Physician: Dr. Annia Belt, MD    First Contact: Dr. Genene Churn Pager: [phone]  Second Contact: Dr. Naaman Plummer Pager: [phone]       After Hours (After 5p/  First Contact Pager: [phone]  weekends / holidays): Second Contact Pager: [phone]   Chief Complaint: AMS  History of Present Illness:   81 yo female with no reported PMH here with AMS from ALF. Patient has been having some increased confusion for last 1 week. Today during breakfast, the aide found her to be nonresponsive, just staring, with some shaking of her extremities. Unsure if she had a seizure. Aide did see some left facial droop. No hx of recent falls. I could not obtain full history. I called the ALF but there was no nurse available, I got brief history from the receptionist.   Was found to be in Afib RVR 137 . Was placed on Dilt drip. Rate improved.   Patient denies any complaint to [place]. She only knows her name. Denies any n/v/diarrhea. Not able to provide much history. Is following some commands.   Meds: Current Facility-Administered Medications  Medication Dose Route Frequency Provider Last Rate Last Dose  . 0.9 %  sodium chloride infusion   Intravenous Continuous Marjan Rabbani, MD 75 mL/hr at 11/06/14 1359 75 mL/hr at 11/06/14 1359  . [START ON 11/07/2014] cefTRIAXone (ROCEPHIN) 1 g in dextrose 5 % 50 mL IVPB  1 g Intravenous Q24H Marjan Rabbani, MD      . ondansetron (ZOFRAN) tablet 4 mg  4 mg Oral Q6H PRN Marjan Rabbani, MD       Or  . ondansetron (ZOFRAN) injection 4 mg  4 mg Intravenous Q6H PRN Marjan Rabbani, MD      . potassium chloride 10 mEq in 100 mL IVPB  10 mEq Intravenous Q1 Hr x 2 Marjan Rabbani, MD 100 mL/hr at 11/06/14 1429 10 mEq at 11/06/14 1429  . sodium  chloride 0.9 % injection 3 mL  3 mL Intravenous Q12H Juluis Mire, MD       Current Outpatient Prescriptions  Medication Sig Dispense Refill  . acetaminophen (TYLENOL) 500 MG tablet Take 500 mg by mouth 3 (three) times daily as needed (pain).    Marland Kitchen aspirin 81 MG chewable tablet Chew 81 mg by mouth every morning.    Marland Kitchen atorvastatin (LIPITOR) 10 MG tablet Take 10 mg by mouth every morning.    . betamethasone dipropionate (DIPROLENE) 0.05 % cream See admin instructions. Apply a thin layer topically to areas of redness/scaling on ankle, knees, back of ands, and low back twice daily as needed for redness/scaling    . Calcium Carbonate-Vitamin D 600-400 MG-UNIT per tablet Take 1 tablet by mouth 2 (two) times daily.    . citalopram (CELEXA) 20 MG tablet Take 20 mg by mouth every morning.    . feeding supplement (BOOST HIGH PROTEIN) LIQD Take 1 Container by mouth every morning.    Marland Kitchen HYDROcodone-acetaminophen (NORCO/VICODIN) 5-325 MG per tablet Take 0.5 tablets by mouth 2 (two) times daily.    Marland Kitchen ketoconazole (NIZORAL) 2 % shampoo See admin instructions. Use as directed three  times weekly (Monday, Wednesday, and Friday) with shower    . loratadine (CLARITIN) 10 MG tablet Take 10 mg by mouth daily as needed for allergies.    . memantine (NAMENDA) 10 MG tablet Take 10 mg by mouth 2 (two) times daily.    . Multiple Vitamins-Minerals (CENTRUM SILVER PO) Take 1 tablet by mouth every morning.    Marland Kitchen omeprazole (PRILOSEC) 20 MG capsule Take 20 mg by mouth daily as needed (heartburn).    Marland Kitchen OVER THE COUNTER MEDICATION Take 4 oz by mouth every 2 (two) hours. Encourage 4oz fluids    . polyethylene glycol (MIRALAX / GLYCOLAX) packet Take 17 g by mouth every morning.    . triamcinolone cream (KENALOG) 0.1 % See admin instructions. Apply to affected areas of rash at bedtime      Allergies: Allergies as of 11/06/2014  . (No Known Allergies)   Past Medical History  Diagnosis Date  . Renal disorder   . GERD  (gastroesophageal reflux disease)   . Hypercholesteremia    History reviewed. No pertinent past surgical history. History reviewed. No pertinent family history. History   Social History  . Marital Status: Single    Spouse Name: N/A  . Number of Children: N/A  . Years of Education: N/A   Occupational History  . Not on file.   Social History Main Topics  . Smoking status: Former Research scientist (life sciences)  . Smokeless tobacco: Not on file  . Alcohol Use: No  . Drug Use: Not on file  . Sexual Activity: Not on file   Other Topics Concern  . Not on file   Social History Narrative  . No narrative on file    Review of Systems: Review of Systems  Unable to perform ROS: mental status change  Gastrointestinal: Negative for nausea, vomiting and diarrhea.    Physical Exam: Blood pressure 94/51, pulse 60, temperature 99.1 F (37.3 C), temperature source Rectal, resp. rate 19, SpO2 94 %. Physical Exam  Constitutional: Vital signs are normal. She appears cachectic. She is cooperative.  HENT:  Head: Normocephalic and atraumatic. Head is without raccoon's eyes, without Battle's sign, without abrasion, without contusion and without laceration.  Very dry oral mucosa  Eyes: Conjunctivae and EOM are normal. Pupils are equal, round, and reactive to light. Right eye exhibits no discharge. Left eye exhibits no discharge. No scleral icterus.  Cardiovascular: Exam reveals no gallop and no friction rub.   No murmur heard. Normal rate and rhythm, with some extra beats occasionally.   Respiratory: Effort normal and breath sounds normal. No respiratory distress. She has no wheezes. She has no rales. She exhibits no tenderness.  Clear on anterior exam.   GI: Soft. She exhibits no distension and no mass. There is no rebound.  Has tenderness to palpation supra pubically.   Musculoskeletal:  No edema on legs.   Neurological: She is alert.  Oriented to self only. Follows some commands. Has mild left sided facial  droop. Moves upper extremities but not moving lower extremities. Has 5/5 strength on upper ext. Not cooperating with lower ext exam.  down going babinski. Normal finger to nose exam.     Lab results: Basic Metabolic Panel:  Recent Labs  11/06/14 1102 11/06/14 1118 11/06/14 1332  NA 174* 177*  --   K 3.4* 3.4*  --   CL >130* 131*  --   CO2 29  --   --   GLUCOSE 116* 112*  --   BUN 59* 57*  --  CREATININE 1.91* 1.80*  --   CALCIUM 10.4  --   --   MG  --   --  3.0*  PHOS  --   --  2.8   Liver Function Tests:  Recent Labs  11/06/14 1102  AST 40*  ALT 31  ALKPHOS 70  BILITOT 1.1  PROT 7.8  ALBUMIN 3.3*   No results for input(s): LIPASE, AMYLASE in the last 72 hours. No results for input(s): AMMONIA in the last 72 hours. CBC:  Recent Labs  11/06/14 1102 11/06/14 1118  WBC 15.6*  --   NEUTROABS 12.5*  --   HGB 15.3* 16.7*  HCT 49.8* 49.0*  MCV 105.7*  --   PLT 224  --    Cardiac Enzymes:  Recent Labs  11/06/14 1332  TROPONINI 0.06*   BNP: No results for input(s): PROBNP in the last 72 hours. D-Dimer: No results for input(s): DDIMER in the last 72 hours. CBG:  Recent Labs  11/06/14 1100  GLUCAP 116*   Hemoglobin A1C: No results for input(s): HGBA1C in the last 72 hours. Fasting Lipid Panel: No results for input(s): CHOL, HDL, LDLCALC, TRIG, CHOLHDL, LDLDIRECT in the last 72 hours. Thyroid Function Tests: No results for input(s): TSH, T4TOTAL, FREET4, T3FREE, THYROIDAB in the last 72 hours. Anemia Panel: No results for input(s): VITAMINB12, FOLATE, FERRITIN, TIBC, IRON, RETICCTPCT in the last 72 hours. Coagulation:  Recent Labs  11/06/14 1102  LABPROT 15.1  INR 1.18   Urine Drug Screen: Drugs of Abuse     Component Value Date/Time   LABOPIA POSITIVE* 11/06/2014 1054   COCAINSCRNUR NONE DETECTED 11/06/2014 1054   LABBENZ NONE DETECTED 11/06/2014 1054   AMPHETMU NONE DETECTED 11/06/2014 1054   THCU NONE DETECTED 11/06/2014 1054    LABBARB NONE DETECTED 11/06/2014 1054    Alcohol Level:  Recent Labs  11/06/14 1102  ETH <5   Urinalysis:  Recent Labs  11/06/14 1054  COLORURINE YELLOW  LABSPEC 1.013  PHURINE 5.5  GLUCOSEU NEGATIVE  HGBUR SMALL*  BILIRUBINUR NEGATIVE  KETONESUR NEGATIVE  PROTEINUR 30*  UROBILINOGEN 0.2  NITRITE POSITIVE*  LEUKOCYTESUR LARGE*   Misc. Labs:  Imaging results:  Ct Head Wo Contrast  11/06/2014   CLINICAL DATA:  Unable to eat. Right-sided neck pain when trying to straighten.  EXAM: CT HEAD WITHOUT CONTRAST  CT CERVICAL SPINE WITHOUT CONTRAST  TECHNIQUE: Multidetector CT imaging of the head and cervical spine was performed following the standard protocol without intravenous contrast. Multiplanar CT image reconstructions of the cervical spine were also generated.  COMPARISON:  Head CT 07/04/2011  FINDINGS: CT HEAD FINDINGS  Skull and Sinuses:Negative for fracture or destructive process. The mastoids, middle ears, and imaged paranasal sinuses are clear.  Orbits: No acute abnormality.  Brain: No evidence of acute infarction, hemorrhage, hydrocephalus, or mass lesion/mass effect. Generalized brain atrophy with ventriculomegaly. Brain atrophy which is progressed from 2012. Ventriculomegaly is likely from central predominant volume loss. There is prominent mesial temporal lobe atrophy, pattern which can be seen with Alzheimer's disease. Chronic small vessel disease ischemic gliosis throughout the bilateral cerebral white matter, also progressed from prior.  CT CERVICAL SPINE FINDINGS  No fracture or subluxation. No erosion of the endplates with facets. No gross cervical canal hematoma or prevertebral edema. The muscular or ligamentous ossification.  Profound osteopenia. There is degenerative disc disease which is focally advanced at C5-6. Despite endplate spurs node notable canal stenosis. Facet ankylosis bilaterally at C4-5 and C5-6. There is advanced degeneration at the  C1-C2 articulation with  bulky spurring about the atlanto dental interval.  Bilateral submandibular sialolithiasis.  No acute inflammation.  Aberrant right subclavian artery, partly visualized.  IMPRESSION: 1. No acute intracranial or cervical spine findings. 2. Progressive brain atrophy and chronic small vessel disease.   Electronically Signed   By: Monte Fantasia M.D.   On: 11/06/2014 12:01   Ct Cervical Spine Wo Contrast  11/06/2014   CLINICAL DATA:  Unable to eat. Right-sided neck pain when trying to straighten.  EXAM: CT HEAD WITHOUT CONTRAST  CT CERVICAL SPINE WITHOUT CONTRAST  TECHNIQUE: Multidetector CT imaging of the head and cervical spine was performed following the standard protocol without intravenous contrast. Multiplanar CT image reconstructions of the cervical spine were also generated.  COMPARISON:  Head CT 07/04/2011  FINDINGS: CT HEAD FINDINGS  Skull and Sinuses:Negative for fracture or destructive process. The mastoids, middle ears, and imaged paranasal sinuses are clear.  Orbits: No acute abnormality.  Brain: No evidence of acute infarction, hemorrhage, hydrocephalus, or mass lesion/mass effect. Generalized brain atrophy with ventriculomegaly. Brain atrophy which is progressed from 2012. Ventriculomegaly is likely from central predominant volume loss. There is prominent mesial temporal lobe atrophy, pattern which can be seen with Alzheimer's disease. Chronic small vessel disease ischemic gliosis throughout the bilateral cerebral white matter, also progressed from prior.  CT CERVICAL SPINE FINDINGS  No fracture or subluxation. No erosion of the endplates with facets. No gross cervical canal hematoma or prevertebral edema. The muscular or ligamentous ossification.  Profound osteopenia. There is degenerative disc disease which is focally advanced at C5-6. Despite endplate spurs node notable canal stenosis. Facet ankylosis bilaterally at C4-5 and C5-6. There is advanced degeneration at the C1-C2 articulation with bulky  spurring about the atlanto dental interval.  Bilateral submandibular sialolithiasis.  No acute inflammation.  Aberrant right subclavian artery, partly visualized.  IMPRESSION: 1. No acute intracranial or cervical spine findings. 2. Progressive brain atrophy and chronic small vessel disease.   Electronically Signed   By: Monte Fantasia M.D.   On: 11/06/2014 12:01   Dg Chest Portable 1 View  11/06/2014   CLINICAL DATA:  Weakness and dysphagia.  Left facial droop.  EXAM: PORTABLE CHEST - 1 VIEW  COMPARISON:  PA and lateral chest 07/04/2011 and 08/04/2008.  FINDINGS: The lungs are clear. Heart size is normal. No pneumothorax or pleural effusion. Vertebral augmentation lower thoracic spine is again seen. Degenerative disease about the shoulders is worse on the right.  IMPRESSION: No acute disease.   Electronically Signed   By: Inge Rise M.D.   On: 11/06/2014 11:07    Other results: EKG: Afib, some ST depression on V5,V5,V6?    Assessment & Plan by Problem: Principal Problem:   Acute encephalopathy Active Problems:   Hypernatremia   AKI (acute kidney injury)   UTI (urinary tract infection)   Atrial fibrillation with rapid ventricular response   Elevated AST (SGOT)   Hypercalcemia   Osteopenia   Hyperglycemia   GERD (gastroesophageal reflux disease)   Allergic rhinitis  81 yo female with hx of Dementia here with worsening AMS from multiple possible factors.  AMS -with confusion and left facial droop? Possible etiologies include seizure (reported shaking, non responsively at ALF, hypernatremia), CVA (left facial droop - could also be todd's paralysis from seizure), UTI, hypernatremia, or AKI.  - CT head negative for hemorrhage. CT neck showed multi level C spine degenerative changes. - swallow eval. - consulted Neurology. Dr. Nicole Kindred recommended for MRI for stroke workup  or any lesion causing seizure. Will consider EEG and other studies depending on what MRI shows.  - f/up further  neuro recs: if shows acute stroke, then would get lipid panel, hgba1c, carotid doppler, MRA. -neuro checks - treat possible reversible causes such as UTI and Hypernatremia. - checking TSH, folate, Vitamin B12.  - r/o ACS: trend trops, some ST depression on V4, v5, v6? Will repeat EKG.  Hypernatremia - unclear if acute or chronic- likely 2/2 to AMS and not eating/drinking with recent infection. On admission 174.  Total free water deficit ~8 Liters. From 174-164 it's 2L free water deficit.  Denies any diarrhea or n/v.  - goal correction 10 meq in 24 hours. So 164 tomorrow. Will do Normal saline 75cc/hr for now since she is also volume depleted. Will titrate rate based on BMET - bmet q6hr - check Urine sodium and Urine osm.  Afib - not sure if has hx of Afib. With RVR 137 in ED - CHADS2VASC at least 2. - started on dilt drip. Now back to NSR. May not need anticoag if it was only temporary  Would be poor candidate for anticoag given  Her age and frailty.  - hold heparin gtt for now. - obtain ECHO - repeat EKG. monitor on Tele. - check TSH.  UTI - seen on UA, has suprapubic tenderness, with AMS, leukocytosis - will treat with ceftriaxone.  - f/up ucx.  High hemoglobin -15-16. Likely 2/2 to volume depletion.  - recheck cbc  Hypercalcemia -unclear etiology.  - 11 corrected with albumin. Could be immobilzation? Primary hyperparathyroidism? Vitamin  - checking Vitamin D, CK (to r/o rhabdomyolysis), TSH ( can be caused by hyperthyroidism).  - check Multiple Myeloma panel (hypercalcemia, AKI, but no bone lesion seen on CT Cspine and CXR.  - repeat BMET.   Hypermagnesemia - 3. Will observe for now - will repeat. If goes up, will treat with with loop diuretics.   Full Code (could not confirm with patient, but ED provider talked to her State Guardian), NPO for now for AMS. Swallow eval pending.  Dispo: Disposition is deferred at this time, awaiting improvement of current medical problems.  Anticipated discharge in approximately 1-2 day(s).   The patient does have a current PCP (No primary care provider on file.) and does need an [facility] hospital follow-up appointment after discharge.  The patient does have transportation limitations that hinder transportation to clinic appointments.  Signed: Dellia Nims, MD 11/06/2014, 3:01 PM

## 2014-11-06 NOTE — ED Notes (Signed)
Pt noted to be back in a sinus rhythm. EDP notified.

## 2014-11-06 NOTE — ED Notes (Signed)
Pt placed back on cardiac monitor, Noted to be tachycardiac. EKG done and given to EDP. Remains on cardiac monitor.

## 2014-11-06 NOTE — ED Notes (Signed)
Attempted report x1. 

## 2014-11-06 NOTE — ED Notes (Signed)
CBG 116  

## 2014-11-07 ENCOUNTER — Inpatient Hospital Stay (HOSPITAL_COMMUNITY): Payer: Medicare Other

## 2014-11-07 ENCOUNTER — Other Ambulatory Visit (HOSPITAL_COMMUNITY): Payer: Medicare Other

## 2014-11-07 DIAGNOSIS — N390 Urinary tract infection, site not specified: Secondary | ICD-10-CM

## 2014-11-07 DIAGNOSIS — E860 Dehydration: Secondary | ICD-10-CM

## 2014-11-07 DIAGNOSIS — I4891 Unspecified atrial fibrillation: Secondary | ICD-10-CM

## 2014-11-07 DIAGNOSIS — F0390 Unspecified dementia without behavioral disturbance: Secondary | ICD-10-CM

## 2014-11-07 DIAGNOSIS — R9431 Abnormal electrocardiogram [ECG] [EKG]: Secondary | ICD-10-CM

## 2014-11-07 LAB — BASIC METABOLIC PANEL
BUN: 55 mg/dL — ABNORMAL HIGH (ref 6–23)
BUN: 56 mg/dL — ABNORMAL HIGH (ref 6–23)
BUN: 53 mg/dL — ABNORMAL HIGH (ref 6–23)
BUN: 53 mg/dL — ABNORMAL HIGH (ref 6–23)
BUN: 52 mg/dL — ABNORMAL HIGH (ref 6–23)
CALCIUM: 9.5 mg/dL (ref 8.4–10.5)
CALCIUM: 9.4 mg/dL (ref 8.4–10.5)
CO2: 27 mmol/L (ref 19–32)
CO2: 29 mmol/L (ref 19–32)
CO2: 28 mmol/L (ref 19–32)
CO2: 25 mmol/L (ref 19–32)
CO2: 25 mmol/L (ref 19–32)
CREATININE: 1.6 mg/dL — AB (ref 0.50–1.10)
CREATININE: 1.66 mg/dL — AB (ref 0.50–1.10)
Calcium: 9.2 mg/dL (ref 8.4–10.5)
Calcium: 8.7 mg/dL (ref 8.4–10.5)
Calcium: 8.7 mg/dL (ref 8.4–10.5)
Chloride: >130 mmol/L (ref 96–112)
Chloride: >130 mmol/L (ref 96–112)
Chloride: >130 mmol/L (ref 96–112)
Chloride: >130 mmol/L (ref 96–112)
Creatinine, Ser: 1.63 mg/dL — ABNORMAL HIGH (ref 0.50–1.10)
Creatinine, Ser: 1.62 mg/dL — ABNORMAL HIGH (ref 0.50–1.10)
Creatinine, Ser: 1.58 mg/dL — ABNORMAL HIGH (ref 0.50–1.10)
GFR calc Af Amer: 33 mL/min — ABNORMAL LOW (ref >90)
GFR calc Af Amer: 32 mL/min — ABNORMAL LOW (ref >90)
GFR calc Af Amer: 32 mL/min — ABNORMAL LOW (ref >90)
GFR calc Af Amer: 31 mL/min — ABNORMAL LOW (ref >90)
GFR calc non Af Amer: 28 mL/min — ABNORMAL LOW (ref >90)
GFR calc non Af Amer: 27 mL/min — ABNORMAL LOW (ref >90)
GFR calc non Af Amer: 28 mL/min — ABNORMAL LOW (ref >90)
GFR, EST AFRICAN AMERICAN: 33 mL/min — AB (ref >90)
GFR, EST NON AFRICAN AMERICAN: 28 mL/min — AB (ref >90)
GFR, EST NON AFRICAN AMERICAN: 27 mL/min — AB (ref >90)
GLUCOSE: 122 mg/dL — AB (ref 70–99)
GLUCOSE: 136 mg/dL — AB (ref 70–99)
GLUCOSE: 180 mg/dL — AB (ref 70–99)
Glucose, Bld: 110 mg/dL — ABNORMAL HIGH (ref 70–99)
Glucose, Bld: 204 mg/dL — ABNORMAL HIGH (ref 70–99)
POTASSIUM: 3.3 mmol/L — AB (ref 3.5–5.1)
Potassium: 3.6 mmol/L (ref 3.5–5.1)
Potassium: 3.4 mmol/L — ABNORMAL LOW (ref 3.5–5.1)
Potassium: 3.2 mmol/L — ABNORMAL LOW (ref 3.5–5.1)
Potassium: 3.3 mmol/L — ABNORMAL LOW (ref 3.5–5.1)
SODIUM: 175 mmol/L — AB (ref 135–145)
SODIUM: 169 mmol/L — AB (ref 135–145)
Sodium: 176 mmol/L (ref 135–145)
Sodium: 173 mmol/L (ref 135–145)
Sodium: 171 mmol/L (ref 135–145)

## 2014-11-07 LAB — MULTIPLE MYELOMA PANEL, SERUM
ALBUMIN SERPL ELPH-MCNC: 2.7 g/dL — AB (ref 3.2–5.6)
ALPHA2 GLOB SERPL ELPH-MCNC: 1.3 g/dL — AB (ref 0.4–1.2)
Albumin/Glob SerPl: 0.7 (ref 0.7–2.0)
Alpha 1: 0.3 g/dL (ref 0.1–0.4)
B-Globulin SerPl Elph-Mcnc: 1.4 g/dL — ABNORMAL HIGH (ref 0.6–1.3)
Gamma Glob SerPl Elph-Mcnc: 1.1 g/dL (ref 0.5–1.6)
Globulin, Total: 4 g/dL (ref 2.0–4.5)
IGA: 564 mg/dL — AB (ref 64–422)
IgG (Immunoglobin G), Serum: 1201 mg/dL (ref 700–1600)
IgM, Serum: 46 mg/dL (ref 26–217)
Total Protein ELP: 6.7 g/dL (ref 6.0–8.5)

## 2014-11-07 LAB — CBC
HEMATOCRIT: 43.9 % (ref 36.0–46.0)
Hemoglobin: 12.9 g/dL (ref 12.0–15.0)
MCH: 31.8 pg (ref 26.0–34.0)
MCHC: 29.4 g/dL — AB (ref 30.0–36.0)
MCV: 108.1 fL — AB (ref 78.0–100.0)
PLATELETS: 186 10*3/uL (ref 150–400)
RBC: 4.06 MIL/uL (ref 3.87–5.11)
RDW: 13.9 % (ref 11.5–15.5)
WBC: 15.5 10*3/uL — AB (ref 4.0–10.5)

## 2014-11-07 LAB — OSMOLALITY: OSMOLALITY: 384 mosm/kg — AB (ref 275–300)

## 2014-11-07 LAB — COMPREHENSIVE METABOLIC PANEL
ALBUMIN: 2.6 g/dL — AB (ref 3.5–5.2)
ALT: 33 U/L (ref 0–35)
AST: 36 U/L (ref 0–37)
Alkaline Phosphatase: 59 U/L (ref 39–117)
BILIRUBIN TOTAL: 0.9 mg/dL (ref 0.3–1.2)
BUN: 53 mg/dL — ABNORMAL HIGH (ref 6–23)
CO2: 25 mmol/L (ref 19–32)
Calcium: 8.8 mg/dL (ref 8.4–10.5)
Chloride: >130 mmol/L (ref 96–112)
Creatinine, Ser: 1.6 mg/dL — ABNORMAL HIGH (ref 0.50–1.10)
GFR calc non Af Amer: 28 mL/min — ABNORMAL LOW (ref >90)
GFR, EST AFRICAN AMERICAN: 33 mL/min — AB (ref >90)
Glucose, Bld: 105 mg/dL — ABNORMAL HIGH (ref 70–99)
Potassium: 3.4 mmol/L — ABNORMAL LOW (ref 3.5–5.1)
Sodium: 174 mmol/L (ref 135–145)
Total Protein: 5.9 g/dL — ABNORMAL LOW (ref 6.0–8.3)

## 2014-11-07 LAB — GLUCOSE, CAPILLARY
GLUCOSE-CAPILLARY: 108 mg/dL — AB (ref 70–99)
GLUCOSE-CAPILLARY: 171 mg/dL — AB (ref 70–99)
Glucose-Capillary: 108 mg/dL — ABNORMAL HIGH (ref 70–99)
Glucose-Capillary: 153 mg/dL — ABNORMAL HIGH (ref 70–99)
Glucose-Capillary: 73 mg/dL (ref 70–99)

## 2014-11-07 LAB — TROPONIN I: Troponin I: 0.06 ng/mL — ABNORMAL HIGH (ref <0.031)

## 2014-11-07 LAB — HEMOGLOBIN A1C
Hgb A1c MFr Bld: 5.7 % — ABNORMAL HIGH (ref 4.8–5.6)
Mean Plasma Glucose: 117 mg/dL

## 2014-11-07 LAB — FOLATE

## 2014-11-07 LAB — HIV ANTIBODY (ROUTINE TESTING W REFLEX): HIV SCREEN 4TH GENERATION: NONREACTIVE

## 2014-11-07 LAB — MAGNESIUM: Magnesium: 2.5 mg/dL (ref 1.5–2.5)

## 2014-11-07 LAB — T4, FREE: Free T4: 1 ng/dL (ref 0.80–1.80)

## 2014-11-07 LAB — VITAMIN D 25 HYDROXY (VIT D DEFICIENCY, FRACTURES): Vit D, 25-Hydroxy: 59.3 ng/mL (ref 30.0–100.0)

## 2014-11-07 LAB — HEPARIN LEVEL (UNFRACTIONATED): Heparin Unfractionated: 0.1 IU/mL — ABNORMAL LOW (ref 0.30–0.70)

## 2014-11-07 LAB — VITAMIN B12: Vitamin B-12: 1202 pg/mL — ABNORMAL HIGH (ref 211–911)

## 2014-11-07 MED ORDER — CHLORHEXIDINE GLUCONATE 0.12 % MT SOLN
15.0000 mL | Freq: Two times a day (BID) | OROMUCOSAL | Status: DC
  Administered 2014-11-07 – 2014-11-10 (×7): 15 mL via OROMUCOSAL
  Filled 2014-11-07 (×11): qty 15

## 2014-11-07 MED ORDER — SODIUM CHLORIDE 0.45 % IV SOLN
INTRAVENOUS | Status: DC
  Administered 2014-11-07 – 2014-11-08 (×2): via INTRAVENOUS

## 2014-11-07 MED ORDER — SODIUM CHLORIDE 0.9 % IV SOLN
INTRAVENOUS | Status: DC
  Administered 2014-11-07: 09:00:00 via INTRAVENOUS

## 2014-11-07 MED ORDER — CETYLPYRIDINIUM CHLORIDE 0.05 % MT LIQD
7.0000 mL | Freq: Two times a day (BID) | OROMUCOSAL | Status: DC
  Administered 2014-11-07 – 2014-11-09 (×6): 7 mL via OROMUCOSAL

## 2014-11-07 MED ORDER — DEXTROSE 5 % IV SOLN
INTRAVENOUS | Status: DC
Start: 2014-11-07 — End: 2014-11-07
  Administered 2014-11-07: 04:00:00 via INTRAVENOUS

## 2014-11-07 MED ORDER — BOOST / RESOURCE BREEZE PO LIQD
1.0000 | Freq: Three times a day (TID) | ORAL | Status: DC
  Administered 2014-11-07 – 2014-11-09 (×4): 1 via ORAL

## 2014-11-07 MED ORDER — HEPARIN SODIUM (PORCINE) 5000 UNIT/ML IJ SOLN
5000.0000 [IU] | Freq: Three times a day (TID) | INTRAMUSCULAR | Status: DC
  Administered 2014-11-07 – 2014-11-11 (×13): 5000 [IU] via SUBCUTANEOUS
  Filled 2014-11-07 (×14): qty 1

## 2014-11-07 NOTE — Progress Notes (Signed)
CRITICAL VALUE ALERT  Critical value received:  Na 176 and Cl >130  Date of notification:  11/07/14  Time of notification:  0540  Critical value read back:Yes.    Nurse who received alert:  W. Work  MD notified (1st page): MD aware of results  Time of first page:    MD notified (2nd page):  Time of second page:  Responding MD:    Time MD responded:    New orders received to cycle BMETs. Will continue to monitor.

## 2014-11-07 NOTE — Progress Notes (Signed)
Subjective: Patient is awake. "I am doing  Fine" but then answers "don't". Tracks my movements but will not follow verbal commands.  NA+ 175 Cl >130 BUN 56 Cr 1.63 WBC 15.5  Objective: Current vital signs: BP 127/57 mmHg  Pulse 56  Temp(Src) 98.5 F (36.9 C) (Oral)  Resp 17  Ht  (1.575 m)  Wt 55.9 kg (123 lb 3.8 oz)  BMI 22.53 kg/m2  SpO2 96% Vital signs in last 24 hours: Temp:  [97.4 F (36.3 C)-99.1 F (37.3 C)] 98.5 F (36.9 C) (04/26 0700) Pulse Rate:  [36-137] 56 (04/26 0724) Resp:  [16-20] 17 (04/26 0724) BP: (94-144)/(47-86) 127/57 mmHg (04/26 0724) SpO2:  [91 %-97 %] 96 % (04/26 0724) Weight:  [55.3 kg (121 lb 14.6 oz)-55.9 kg (123 lb 3.8 oz)] 55.9 kg (123 lb 3.8 oz) (04/26 0500)  Intake/Output from previous day: 04/25 0701 - 04/26 0700 In: 349.8 [I.V.:349.8] Out: -  Intake/Output this shift:   Nutritional status:    Neurologic Exam:  Mental Status: Alert, does not follow verbal or visual commands. Tracks my movements.  States "don't" when passively moved.  Cranial Nerves: II: blinks to threat bilaterally, pupils equal, round, reactive to light and accommodation III,IV, VI: ptosis not present, extra-ocular motions intact bilaterally V,VII: smile symmetric, with right facial droop at rest.  facial light touch sensation normal bilaterally VIII: hearing normal bilaterally   Motor: Moves all extremities antigravity, with increased tone throughout.  Sensory: Pinprick and light touch intact throughout, bilaterally Deep Tendon Reflexes:  2+ throughout UE and bilateral KJ and no AJ  Plantars: Mute bilaterally    Lab Results: Basic Metabolic Panel:  Recent Labs Lab 11/06/14 1102 11/06/14 1118 11/06/14 1332 11/06/14 1840 11/07/14 0138 11/07/14 0352 11/07/14 0740  NA 174* 177*  --  173* 174* 176* 175*  K 3.4* 3.4*  --  3.3* 3.4* 3.6 3.4*  CL >130* 131*  --  >130* >130* >130* >130*  CO2 29  --   --  GLUCOSE 116* 112*  --  116*  105* 110* 122*  BUN 59* 57*  --  57* 53* 55* 56*  CREATININE 1.91* 1.80*  --  1.67* 1.60* 1.60* 1.63*  CALCIUM 10.4  --   --  9.8 8.8 9.5 9.4  MG  --   --  3.0*  --  2.5  --   --   PHOS  --   --  2.8  --   --   --   --     Liver Function Tests:  Recent Labs Lab 11/06/14 1102 11/07/14 0138  AST 40* 36  ALT 31 33  ALKPHOS 70 59  BILITOT 1.1 0.9  PROT 7.8 5.9*  ALBUMIN 3.3* 2.6*   No results for input(s): LIPASE, AMYLASE in the last 168 hours. No results for input(s): AMMONIA in the last 168 hours.  CBC:  Recent Labs Lab 11/06/14 1102 11/06/14 1118 11/07/14 0138  WBC 15.6*  --  15.5*  NEUTROABS 12.5*  --   --   HGB 15.3* 16.7* 12.9  HCT 49.8* 49.0* 43.9  MCV 105.7*  --  108.1*  PLT 224  --  186    Cardiac Enzymes:  Recent Labs Lab 11/06/14 1332 11/06/14 1840 11/07/14 0138  CKTOTAL  --  55  --   TROPONINI 0.06* 0.07* 0.06*    Lipid Panel:  Recent Labs Lab 11/06/14 1335  CHOL 147  TRIG 104  HDL 30*  CHOLHDL 4.9  VLDL 21  LDLCALC 96    CBG:  Recent Labs Lab 11/06/14 1100  GLUCAP 116*    Microbiology: Results for orders placed or performed during the hospital encounter of 11/06/14  MRSA PCR Screening     Status: None   Collection Time: 11/06/14  6:00 PM  Result Value Ref Range Status   MRSA by PCR NEGATIVE NEGATIVE Final    Comment:        The GeneXpert MRSA Assay (FDA approved for NASAL specimens only), is one component of a comprehensive MRSA colonization surveillance program. It is not intended to diagnose MRSA infection nor to guide or monitor treatment for MRSA infections.     Coagulation Studies:  Recent Labs  11/06/14 1102  LABPROT 15.1  INR 1.18    Imaging: Ct Head Wo Contrast  11/06/2014   CLINICAL DATA:  Unable to eat. Right-sided neck pain when trying to straighten.  EXAM: CT HEAD WITHOUT CONTRAST  CT CERVICAL SPINE WITHOUT CONTRAST  TECHNIQUE: Multidetector CT imaging of the head and cervical spine was performed  following the standard protocol without intravenous contrast. Multiplanar CT image reconstructions of the cervical spine were also generated.  COMPARISON:  Head CT 07/04/2011  FINDINGS: CT HEAD FINDINGS  Skull and Sinuses:Negative for fracture or destructive process. The mastoids, middle ears, and imaged paranasal sinuses are clear.  Orbits: No acute abnormality.  Brain: No evidence of acute infarction, hemorrhage, hydrocephalus, or mass lesion/mass effect. Generalized brain atrophy with ventriculomegaly. Brain atrophy which is progressed from 2012. Ventriculomegaly is likely from central predominant volume loss. There is prominent mesial temporal lobe atrophy, pattern which can be seen with Alzheimer's disease. Chronic small vessel disease ischemic gliosis throughout the bilateral cerebral white matter, also progressed from prior.  CT CERVICAL SPINE FINDINGS  No fracture or subluxation. No erosion of the endplates with facets. No gross cervical canal hematoma or prevertebral edema. The muscular or ligamentous ossification.  Profound osteopenia. There is degenerative disc disease which is focally advanced at C5-6. Despite endplate spurs node notable canal stenosis. Facet ankylosis bilaterally at C4-5 and C5-6. There is advanced degeneration at the C1-C2 articulation with bulky spurring about the atlanto dental interval.  Bilateral submandibular sialolithiasis.  No acute inflammation.  Aberrant right subclavian artery, partly visualized.  IMPRESSION: 1. No acute intracranial or cervical spine findings. 2. Progressive brain atrophy and chronic small vessel disease.   Electronically Signed   By: Marnee SpringJonathon  Watts M.D.   On: 11/06/2014 12:01   Ct Cervical Spine Wo Contrast  11/06/2014   CLINICAL DATA:  Unable to eat. Right-sided neck pain when trying to straighten.  EXAM: CT HEAD WITHOUT CONTRAST  CT CERVICAL SPINE WITHOUT CONTRAST  TECHNIQUE: Multidetector CT imaging of the head and cervical spine was performed  following the standard protocol without intravenous contrast. Multiplanar CT image reconstructions of the cervical spine were also generated.  COMPARISON:  Head CT 07/04/2011  FINDINGS: CT HEAD FINDINGS  Skull and Sinuses:Negative for fracture or destructive process. The mastoids, middle ears, and imaged paranasal sinuses are clear.  Orbits: No acute abnormality.  Brain: No evidence of acute infarction, hemorrhage, hydrocephalus, or mass lesion/mass effect. Generalized brain atrophy with ventriculomegaly. Brain atrophy which is progressed from 2012. Ventriculomegaly is likely from central predominant volume loss. There is prominent mesial temporal lobe atrophy, pattern which can be seen with Alzheimer's disease. Chronic small vessel disease ischemic gliosis throughout the bilateral cerebral white matter, also progressed from prior.  CT CERVICAL SPINE FINDINGS  No fracture or  subluxation. No erosion of the endplates with facets. No gross cervical canal hematoma or prevertebral edema. The muscular or ligamentous ossification.  Profound osteopenia. There is degenerative disc disease which is focally advanced at C5-6. Despite endplate spurs node notable canal stenosis. Facet ankylosis bilaterally at C4-5 and C5-6. There is advanced degeneration at the C1-C2 articulation with bulky spurring about the atlanto dental interval.  Bilateral submandibular sialolithiasis.  No acute inflammation.  Aberrant right subclavian artery, partly visualized.  IMPRESSION: 1. No acute intracranial or cervical spine findings. 2. Progressive brain atrophy and chronic small vessel disease.   Electronically Signed   By: Marnee Spring M.D.   On: 11/06/2014 12:01   Mr Brain Wo Contrast  11/06/2014   CLINICAL DATA:  81 year old female with hypercholesterolemia presenting with increased confusion over the past week. Initial encounter.  EXAM: MRI HEAD WITHOUT CONTRAST  TECHNIQUE: Multiplanar, multiecho pulse sequences of the brain and  surrounding structures were obtained without intravenous contrast.  COMPARISON:  Several prior CTs, most recent 11/06/2014 and most remote 09/01/2008.  FINDINGS: Exam is motion degraded.  No acute infarct.  No intracranial hemorrhage.  Moderate small vessel disease type changes.  Global prominent atrophy. Ventricular prominence may be related to atrophy although difficult to completely exclude a component of superimposed mild hydrocephalus. The degree of ventricular prominence has progressed since most remote exam of 2010.  No intracranial mass lesion noted on this unenhanced exam.  Major intracranial vascular structures are patent.  Cervical medullary junction, pituitary region, pineal region and orbital structures unremarkable.  IMPRESSION: Exam is motion degraded.  No acute infarct.  Moderate small vessel disease type changes.  Global prominent atrophy. Ventricular prominence may be related to atrophy although difficult to completely exclude a component of superimposed mild hydrocephalus. The degree of ventricular prominence has progressed since most remote exam of 2010.   Electronically Signed   By: Lacy Duverney M.D.   On: 11/06/2014 16:52   Dg Chest Portable 1 View  11/06/2014   CLINICAL DATA:  Weakness and dysphagia.  Left facial droop.  EXAM: PORTABLE CHEST - 1 VIEW  COMPARISON:  PA and lateral chest 07/04/2011 and 08/04/2008.  FINDINGS: The lungs are clear. Heart size is normal. No pneumothorax or pleural effusion. Vertebral augmentation lower thoracic spine is again seen. Degenerative disease about the shoulders is worse on the right.  IMPRESSION: No acute disease.   Electronically Signed   By: Drusilla Kanner M.D.   On: 11/06/2014 11:07    Medications:  Scheduled: . antiseptic oral rinse  7 mL Mouth Rinse q12n4p  . cefTRIAXone (ROCEPHIN) IVPB 1 gram/50 mL D5W  1 g Intravenous Q24H  . chlorhexidine  15 mL Mouth Rinse BID  . sodium chloride  3 mL Intravenous Q12H     Assessment/Plan:  81 year old lady presenting with encephalopathic state, most likely secondary to multiple factors, including underlying dementia, hypernatremia, dehydration, urinary tract infection. MRI negative for acute stroke.   Recommend: 1) Continue to treat metabolic/infectious issues.  2) No further neurodiagnostic studies indicated for now. We will see her in follow-up on an as-needed basis.   Felicie Morn PA-C [facility] [phone]  I personally participated in this patient's evaluation and management, including formulating above clinical impression and management recommendations.  Venetia Maxon M.D. [facility] [phone]  11/07/2014, 10:00 AM

## 2014-11-07 NOTE — Significant Event (Signed)
CRITICAL VALUE ALERT  Critical value received:  Na+ 171, Cl- >130  Date of notification:  11/07/14  Time of notification:  1730  Critical value read back: yes  Nurse who received alert:  Cindy HazyJillian Korin Setzler  MD notified (1st page):  Internal Medicine TS Resident pager  Time of first page:  1730  MD notified (2nd page): IM TS resident pager #[phone]  Time of second page: 1540  Responding MD:  IN TS Resident  Time MD responded:  1741, no new orders at this time, no s/s of acute distress noted.

## 2014-11-07 NOTE — Progress Notes (Signed)
INITIAL NUTRITION ASSESSMENT  DOCUMENTATION CODES Per approved criteria  -Not Applicable   INTERVENTION: -Resource Breeze po TID, each supplement provides 250 kcal and 9 grams of protein -RD to follow for diet advancement  NUTRITION DIAGNOSIS: Predicted suboptimal nutrient intake related to AMS as evidenced by lethargy.   Goal: Pt will meet >90% of estimated nutritional needs  Monitor:  Diet advancement, PO/supplement intake, labs, weight changes, I/O's  Reason for Assessment: MST=3  81 y.o. female  Admitting Dx: Acute encephalopathy  Molly Jimenez is an 81 y.o. female with a history of renal disorder, atrial fibrillation not on anticoagulation, GERD, hypercholesterolemia and dementia, brought to the emergency room for evaluation of altered mental status. Patient reportedly has become progressively more confused over the past 1 week. She was noted today to be staring and not responding to the caretaker that was feeding her. There was also an equivocal right facial droop.   ASSESSMENT: Pt admitted from ALF for acute encephalopathy. Pt is very lethargic and unable to participate in interview. Spoke with RN who reports that pt was very independent PTA and was able to feed herself.  SLP completed BSE and recommended a dysphagia 1 diet with thin liquids. However, RN reports that pt is currently on clear liquids due to lethargy, however, pt will swallow without difficulty. Per MD notes, MRI negative for acute infarct.  Exam revealed mild muscle depletion in lower extremities, but suspect that this is related to advanced aged and possible decreased mobility.  Labs reviewed. Na: 175, K: 3.4, Cl <130, BUN/Creat: 56/1.63, Mg: 3.0, Glucose: 122. CBGS: 116.   Nutrition Focused Physical Exam:  Subcutaneous Fat:  Orbital Region: mild depletion Upper Arm Region: WDL Thoracic and Lumbar Region: WDL  Muscle:  Temple Region: WDL Clavicle Bone Region: WDL Clavicle and Acromion Bone  Region: WDL Scapular Bone Region: WDL Dorsal Hand: WDL Patellar Region: mild depletion Anterior Thigh Region: mild depletion Posterior Calf Region: mild depletion  Edema: none present  Height: Ht Readings from Last 1 Encounters:  11/06/14 5\' 2"  (1.575 m)    Weight: Wt Readings from Last 1 Encounters:  11/07/14 123 lb 3.8 oz (55.9 kg)    Ideal Body Weight: 110#  % Ideal Body Weight: 112%  Wt Readings from Last 10 Encounters:  11/07/14 123 lb 3.8 oz (55.9 kg)    Usual Body Weight: unknown  % Usual Body Weight: unknown  BMI:  Body mass index is 22.53 kg/(m^2).  Estimated Nutritional Needs: Kcal: 1400-1600 Protein: 60-70 grams Fluid: 1.4-1.6 L  Skin: fissure on mid sacrum  Diet Order: DIET - DYS 1 Room service appropriate?: Yes; Fluid consistency:: Thin  EDUCATION NEEDS: -Education not appropriate at this time   Intake/Output Summary (Last 24 hours) at 11/07/14 1157 Last data filed at 11/07/14 0600  Gross per 24 hour  Intake 349.75 ml  Output      0 ml  Net 349.75 ml    Last BM: PTA  Labs:   Recent Labs Lab 11/06/14 1332  11/07/14 0138 11/07/14 0352 11/07/14 0740  NA  --   < > 174* 176* 175*  K  --   < > 3.4* 3.6 3.4*  CL  --   < > >130* >130* >130*  CO2  --   < > 25 27 29   BUN  --   < > 53* 55* 56*  CREATININE  --   < > 1.60* 1.60* 1.63*  CALCIUM  --   < > 8.8 9.5 9.4  MG  3.0*  --  2.5  --   --   PHOS 2.8  --   --   --   --   GLUCOSE  --   < > 105* 110* 122*  < > = values in this interval not displayed.  CBG (last 3)   Recent Labs  11/06/14 1100  GLUCAP 116*    Scheduled Meds: . antiseptic oral rinse  7 mL Mouth Rinse q12n4p  . cefTRIAXone (ROCEPHIN) IVPB 1 gram/50 mL D5W  1 g Intravenous Q24H  . chlorhexidine  15 mL Mouth Rinse BID  . sodium chloride  3 mL Intravenous Q12H    Continuous Infusions: . sodium chloride 75 mL/hr at 11/07/14 1610    Past Medical History  Diagnosis Date  . Renal disorder   . GERD  (gastroesophageal reflux disease)   . Hypercholesteremia     History reviewed. No pertinent past surgical history.  Shuree Brossart A. Mayford Knife, RD, LDN, CDE Pager: [phone] After hours Pager: [phone]

## 2014-11-07 NOTE — Consult Note (Signed)
Molly Jimenez Admit Date: 11/06/2014 11/07/2014 Molly Jimenez, Molly Jimenez B Requesting Physician:  Cyndie ChimeGranfortuna MD  Reason for Consult:  Hypernatremia, inc SCr HPI:  81 year old female seen at the request of Dr. Cyndie ChimeGranfortuna for the evaluation of elevated serum sodium and creatinine values. Patient was admitted last night with a finding of a serum sodium of 174 and serum creatinine of 1.91. She comes from a skilled nursing facility. She carries a history of dementia and was more confused lately. Urine osmolality is 460 and urine sodium sodium is 47. A Foley catheter was just recently placed and she had 3 unmeasured voids before hand. There are no diuretics on her home medication record. CT of the head last evening demonstrated progressive atrophy of the brain. No focal findings. She has been awake and interactive at times, remaining confused. She is eating and drinking some. She has pyuria and a bladder infection, she has started on ceftriaxone.   SODIUM (mmol/L)  Date Value  11/07/2014 175*  11/07/2014 176*  11/07/2014 174*  11/06/2014 173*  11/06/2014 177*  11/06/2014 174*  ]   CREATININE, SER (mg/dL)  Date Value  40/98/119104/26/2016 1.63*  11/07/2014 1.60*  11/07/2014 1.60*  11/06/2014 1.67*  11/06/2014 1.80*  11/06/2014 1.91*  ] I/Os: I/O last 3 completed shifts: In: 349.8 [I.V.:349.8] Out: -    ROS Balance of 12 systems is negative w/ exceptions as above  PMH  Past Medical History  Diagnosis Date  . Renal disorder   . GERD (gastroesophageal reflux disease)   . Hypercholesteremia    PSH History reviewed. No pertinent past surgical history. FH History reviewed. No pertinent family history. SH  reports that she has quit smoking. She does not have any smokeless tobacco history on file. She reports that she does not drink alcohol. Her drug history is not on file. Allergies No Known Allergies Home medications Prior to Admission medications   Medication Sig Start Date End Date Taking?  Authorizing Provider  acetaminophen (TYLENOL) 500 MG tablet Take 500 mg by mouth 3 (three) times daily as needed (pain).   Yes Historical Provider, MD  aspirin 81 MG chewable tablet Chew 81 mg by mouth every morning.   Yes Historical Provider, MD  atorvastatin (LIPITOR) 10 MG tablet Take 10 mg by mouth every morning.   Yes Historical Provider, MD  betamethasone dipropionate (DIPROLENE) 0.05 % cream See admin instructions. Apply a thin layer topically to areas of redness/scaling on ankle, knees, back of ands, and low back twice daily as needed for redness/scaling   Yes Historical Provider, MD  Calcium Carbonate-Vitamin D 600-400 MG-UNIT per tablet Take 1 tablet by mouth 2 (two) times daily.   Yes Historical Provider, MD  citalopram (CELEXA) 20 MG tablet Take 20 mg by mouth every morning.   Yes Historical Provider, MD  feeding supplement (BOOST HIGH PROTEIN) LIQD Take 1 Container by mouth every morning.   Yes Historical Provider, MD  HYDROcodone-acetaminophen (NORCO/VICODIN) 5-325 MG per tablet Take 0.5 tablets by mouth 2 (two) times daily.   Yes Historical Provider, MD  ketoconazole (NIZORAL) 2 % shampoo See admin instructions. Use as directed three times weekly (Monday, Wednesday, and Friday) with shower   Yes Historical Provider, MD  loratadine (CLARITIN) 10 MG tablet Take 10 mg by mouth daily as needed for allergies.   Yes Historical Provider, MD  memantine (NAMENDA) 10 MG tablet Take 10 mg by mouth 2 (two) times daily.   Yes Historical Provider, MD  Multiple Vitamins-Minerals (CENTRUM SILVER PO) Take 1 tablet  by mouth every morning.   Yes Historical Provider, MD  omeprazole (PRILOSEC) 20 MG capsule Take 20 mg by mouth daily as needed (heartburn).   Yes Historical Provider, MD  OVER THE COUNTER MEDICATION Take 4 oz by mouth every 2 (two) hours. Encourage 4oz fluids   Yes Historical Provider, MD  polyethylene glycol (MIRALAX / GLYCOLAX) packet Take 17 g by mouth every morning.   Yes Historical  Provider, MD  triamcinolone cream (KENALOG) 0.1 % See admin instructions. Apply to affected areas of rash at bedtime   Yes Historical Provider, MD    Current Medications Scheduled Meds: . antiseptic oral rinse  7 mL Mouth Rinse q12n4p  . cefTRIAXone (ROCEPHIN) IVPB 1 gram/50 mL D5W  1 g Intravenous Q24H  . chlorhexidine  15 mL Mouth Rinse BID  . feeding supplement (RESOURCE BREEZE)  1 Container Oral TID BM  . heparin subcutaneous  5,000 Units Subcutaneous 3 times per day  . sodium chloride  3 mL Intravenous Q12H   Continuous Infusions: . sodium chloride     PRN Meds:.ondansetron **OR** ondansetron (ZOFRAN) IV  CBC  Recent Labs Lab 11/06/14 1102 11/06/14 1118 11/07/14 0138  WBC 15.6*  --  15.5*  NEUTROABS 12.5*  --   --   HGB 15.3* 16.7* 12.9  HCT 49.8* 49.0* 43.9  MCV 105.7*  --  108.1*  PLT 224  --  186   Basic Metabolic Panel  Recent Labs Lab 11/06/14 1102 11/06/14 1118 11/06/14 1332 11/06/14 1840 11/07/14 0138 11/07/14 0352 11/07/14 0740  NA 174* 177*  --  173* 174* 176* 175*  K 3.4* 3.4*  --  3.3* 3.4* 3.6 3.4*  CL >130* 131*  --  >130* >130* >130* >130*  CO2 29  --   --  GLUCOSE 116* 112*  --  116* 105* 110* 122*  BUN 59* 57*  --  57* 53* 55* 56*  CREATININE 1.91* 1.80*  --  1.67* 1.60* 1.60* 1.63*  CALCIUM 10.4  --   --  9.8 8.8 9.5 9.4  PHOS  --   --  2.8  --   --   --   --     Physical Exam  Blood pressure 139/60, pulse 57, temperature 97.9 F (36.6 C), temperature source Oral, resp. rate 14, height  (1.575 m), weight 55.9 kg (123 lb 3.8 oz), SpO2 94 %. GEN: awake, not interactive ENT: Dry MM EYES: EOMI, sunken CV: IRIR, soft murmur, systolic PULM: CTAB ABD: s/nt/nd SKIN: no rashes/lesions EXT:no edema   Assessment/Plan 75F with hypernatremia in the setting of elevated serum creatinine. She has a history of dementia and lives at a nursing facility. She also questionably has a bladder infection. Most commonly this would be an  elderly woman with dementia who develops an acute insult such as UTI and has inability to ask for free water thus leading to dehydration and hypernatremia from impaired free water intake. That certainly is the most likely case here.   I agree that her urine osmolality would be expected to be higher if that was the case. The other possibility would be partial diabetes insipidus. There is no preceding history of polyuria nor is there a lesion on brain imaging to provide any guidance here. He could be a she has pre-existing chronic kidney disease limiting her ability to concentrate her urine; therefore a urine all modalities of 450-500 is maximally concentrated for her.  Moving forward I think we should provide aggressive  water and volume replacement (she is dehydrated and hypovolemic) with careful monitoring of serum sodium and urine output. If she is polyuric and does not correct we will need to consider a trial of ADH.  1. Hypernatremia 1. Likely impaired free water access, can't rule out DI 2. Change IVF to 1/2NS @ 125mL/hr 3. q4h Serum Na levels 4. Goal correction of serum Na is no greater than 12 in 24h 5. Agree with Foley Catheter 6. Will place PICC to facilitate IVFs and lab draw 7. Repeat UOsm and UNa in  AM 2. AKI vs CKD: no baseline data: 1. follow with time 2. Renal US 3. Eval for monoclonal proces 3. Dementia at baseline, in SNF 4. UTI on ceftriaxone, U and B Cx pending 5. AFib with some RVR upon presentation   Sabra Heck MD [phone] pgr 11/07/2014, 3:52 PM

## 2014-11-07 NOTE — Progress Notes (Signed)
CRITICAL VALUE ALERT  Critical value received:  Na = 173 and Cl >130  Date of notification:  11/06/14  Time of notification:  2000  Critical value read back:Yes.    Nurse who received alert:  K. Vincenza HewsUssery  MD notified (1st page):  Dr. Andrey CampanileWilson  Time of first page:  2000  MD notified (2nd page):  Time of second page:  Responding MD:  Dr. Andrey CampanileWilson  Time MD responded:  2005  IVF order confirmed. No new orders received. Will continue to monitor.

## 2014-11-07 NOTE — Progress Notes (Signed)
  Echocardiogram 2D Echocardiogram has been performed.  Aris EvertsRix, Rashena Dowling A 11/07/2014, 1:57 PM

## 2014-11-07 NOTE — Significant Event (Signed)
CRITICAL VALUE ALERT  Critical value received:  Serum osmolality 384  Date of notification:  11/07/14  Time of notification:  0850  Critical value read back: yes  Nurse who received alert:  Cindy HazyJillian Latrisa Hellums RN  MD notified (1st page):  Dr. Cyndie ChimeGranfortuna  Time of first page:  [phone]  MD notified (2nd page):  Time of second page:  Responding MD:  Dr. Vinnie LangtonGranfortua  Time MD responded:  [phone], received new orders for NS at 775ml/hr. No s/s of acute distress noted.

## 2014-11-07 NOTE — Progress Notes (Signed)
14 fr foley placed per MD order/protocol d/t strict I&O. Pt tolerated procedure well Jess RN present as second person during insertion. Peri care care with soap and water provided prior to insertion and sterile procedure maintained throughout. Will continue to monitor.

## 2014-11-07 NOTE — Significant Event (Signed)
CRITICAL VALUE ALERT  Critical value received:  Na+ 175 and Cl- >130  Date of notification:  11/07/14  Time of notification:  0920  Critical value read back: yes  Nurse who received alert:  Cindy HazyJillian Rylon Poitra RN  MD notified (1st page):  Dr. Cyndie ChimeGranfortuna  Time of first page:  0920  MD notified (2nd page):  Time of second page:  Responding MD:  Dr. Cyndie ChimeGranfortuna  Time MD responded:  [phone]

## 2014-11-07 NOTE — Evaluation (Signed)
Clinical/Bedside Swallow Evaluation Patient Details  Name: Molly Jimenez MRN: [id number] Date of Birth: 1926/05/30  Today's Date: 11/07/2014 Time: SLP Start Time (ACUTE ONLY): 1100 SLP Stop Time (ACUTE ONLY): 1116 SLP Time Calculation (min) (ACUTE ONLY): 16 min  Past Medical History:  Past Medical History  Diagnosis Date  . Renal disorder   . GERD (gastroesophageal reflux disease)   . Hypercholesteremia    Past Surgical History: History reviewed. No pertinent past surgical history. HPI:  Molly Jimenez is an 81 y.o. female with a history of renal disorder, atrial fibrillation not on anticoagulation, GERD, hypercholesterolemia and dementia, brought to the emergency room for evaluation of altered mental status. MRI negative for acute infarct.   Assessment / Plan / Recommendation Clinical Impression  Pt swallows thin liquids and purees swiftly, without overt evidence of oral holding or aspiration. Mastication and posterior transit of soft solids is effortful, and at the time of assessment pt is not following commands and resistent to tactile cueing from SLP. Recommend to initiate Dys 1 diet and thin liquids with full supervision. SLP to follow for tolerance and possible advancement as AMS clears.    Aspiration Risk  Mild    Diet Recommendation Dysphagia 1 (Puree);Thin liquid   Liquid Administration via: Straw;Cup Medication Administration: Crushed with puree Supervision: Full supervision/cueing for compensatory strategies;Staff to assist with self feeding Compensations: Slow rate;Small sips/bites Postural Changes and/or Swallow Maneuvers: Seated upright 90 degrees;Upright 30-60 min after meal    Other  Recommendations Oral Care Recommendations: Oral care BID   Follow Up Recommendations   (tba)    Frequency and Duration min 2x/week  2 weeks   [facility]ert[facility]    SLP Swallow Goals     Swallow Study Prior Functional Status       General HPI: Molly Jimenez is an 81 y.o. female with a history of renal disorder, atrial fibrillation not on anticoagulation, GERD, hypercholesterolemia and dementia, brought to the emergency room for evaluation of altered mental status. MRI negative for acute infarct. Type of Study: Bedside swallow evaluation Previous Swallow Assessment: none in chart Diet Prior to this Study: NPO Temperature Spikes Noted:  (99.1) Respiratory Status: Room air History of Recent Intubation: No Behavior/Cognition: Alert;Cooperative;Doesn't follow directions Oral Cavity - Dentition: Adequate natural dentition (as able to be observed) Self-Feeding Abilities: Needs assist Patient Positioning: Upright in bed Baseline Vocal Quality: Clear Volitional Cough: Cognitively unable to elicit Volitional Swallow: Unable to elicit    Oral/Motor/Sensory Function     Ice Chips Ice chips: Within functional limits Presentation: Spoon   Thin Liquid Thin Liquid: Within functional limits Presentation: Cup;Straw    Nectar Thick Nectar Thick Liquid: Not tested   Honey Thick Honey Thick Liquid: Not tested   Puree Puree: Within functional limits Presentation: Spoon   Solid   Solid: Impaired Oral Phase Impairments: Impaired mastication;Impaired anterior to posterior transit      Maxcine HamLaura Paiewonsky, M.A. CCC-SLP [phone]  Maxcine Hamaiewonsky, Molly Jimenez 11/07/2014,11:29 AM

## 2014-11-07 NOTE — Progress Notes (Signed)
Dr. Marisue HumbleSanford notified about being unable to get consent for PICC line placement. Per Dr. Marisue HumbleSanford, he will address it in the morning.

## 2014-11-07 NOTE — Progress Notes (Signed)
CRITICAL VALUE ALERT  Critical value received:  Na 174 and Cl > 130  Date of notification:  4./26/16  Time of notification:  0300  Critical value read back:Yes.    Nurse who received alert:  K. Vincenza HewsUssery  MD notified (1st page):  Dr. Andrey CampanileWilson  Time of first page:  0300  MD notified (2nd page):  Time of second page:  Responding MD:  Dr. Andrey CampanileWilson  Time MD responded:  [phone]  New IVF orders received. Will carry out orders and continue to monitor.

## 2014-11-07 NOTE — Significant Event (Signed)
CRITICAL VALUE ALERT  Critical value received:  Na+ 173, Cl- >130  Date of notification:  11/07/14  Time of notification:  1620  Critical value read back: yes  Nurse who received alert:  Cindy HazyJillian Lonni Dirden  MD notified (1st page):  Dr. Tasia CatchingsAhmed  Time of first page:  1620  MD notified (2nd page):  Time of second page:  Responding MD:  Dr. Tasia CatchingsAhmed  Time MD responded:  1620, no new orders

## 2014-11-07 NOTE — Progress Notes (Signed)
Subjective:  Continues to be disoriented. Denies any complaint other than saying "stop hurting me" during exam. No fever overnight, BP stable. UOP not recorded.   Objective: Vital signs in last 24 hours: Filed Vitals:   11/07/14 0500 11/07/14 0515 11/07/14 0700 11/07/14 0724  BP:  130/65  127/57  Pulse:  59  56  Temp:  97.9 F (36.6 C) 98.5 F (36.9 C)   TempSrc:  Axillary Oral   Resp:  20  17  Height:      Weight: 123 lb 3.8 oz (55.9 kg)     SpO2:  94%  96%   Weight change:   Intake/Output Summary (Last 24 hours) at 11/07/14 1151 Last data filed at 11/07/14 0600  Gross per 24 hour  Intake 349.75 ml  Output      0 ml  Net 349.75 ml   Vitals reviewed. General: resting in bed, confused, follows some commands.  HEENT: PERRL, EOMI, no scleral icterus. Oral mucosa more moist today. Cardiac: RRR, no rubs, murmurs or gallops Pulm: clear to auscultation bilaterally, no wheezes, rales, or rhonchi Abd: soft, nontender, nondistended, BS present Ext: warm and well perfused, no pedal edema Neuro: alert and oriented t self only, mild left sided facial droop. Moves all ext equally. Down going babinski.   Lab Results: Basic Metabolic Panel:  Recent Labs Lab 11/06/14 1332  11/07/14 0138 11/07/14 0352 11/07/14 0740  NA  --   < > 174* 176* 175*  K  --   < > 3.4* 3.6 3.4*  CL  --   < > >130* >130* >130*  CO2  --   < > _0 GLUCOSE  --   < > 105* 110* 122*  BUN  --   < > 53* 55* 56*  CREATININE  --   < > 1.60* 1.60* 1.63*  CALCIUM  --   < > 8.8 9.5 9.4  MG 3.0*  --  2.5  --   --   PHOS 2.8  --   --   --   --   < > = values in this interval not displayed. Liver Function Tests:  Recent Labs Lab 11/06/14 1102 11/07/14 0138  AST 40* 36  ALT 31 33  ALKPHOS 70 59  BILITOT 1.1 0.9  PROT 7.8 5.9*  ALBUMIN 3.3* 2.6*   No results for input(s): LIPASE, AMYLASE in the last 168 hours. No results for input(s): AMMONIA in the last 168 hours. CBC:  Recent Labs Lab  11/06/14 1102 11/06/14 1118 11/07/14 0138  WBC 15.6*  --  15.5*  NEUTROABS 12.5*  --   --   HGB 15.3* 16.7* 12.9  HCT 49.8* 49.0* 43.9  MCV 105.7*  --  108.1*  PLT 224  --  186   Cardiac Enzymes:  Recent Labs Lab 11/06/14 1332 11/06/14 1840 11/07/14 0138  CKTOTAL  --  55  --   TROPONINI 0.06* 0.07* 0.06*   CBG:  Recent Labs Lab 11/06/14 1100  GLUCAP 116*   Hemoglobin A1C:  Recent Labs Lab 11/06/14 1333  HGBA1C 5.7*   Fasting Lipid Panel:  Recent Labs Lab 11/06/14 1335  CHOL 147  HDL 30*  LDLCALC 96  TRIG 104  CHOLHDL 4.9   Thyroid Function Tests:  Recent Labs Lab 11/06/14 1333 11/06/14 1840  TSH 0.168*  --   FREET4  --  1.00   Coagulation:  Recent Labs Lab 11/06/14 1102  LABPROT 15.1  INR 1.18   Anemia  Panel:  Recent Labs Lab 11/06/14 1840  VITAMINB12 1202*  FOLATE >20.0   Urine Drug Screen: Drugs of Abuse     Component Value Date/Time   LABOPIA POSITIVE* 11/06/2014 1054   COCAINSCRNUR NONE DETECTED 11/06/2014 1054   LABBENZ NONE DETECTED 11/06/2014 1054   AMPHETMU NONE DETECTED 11/06/2014 1054   THCU NONE DETECTED 11/06/2014 1054   LABBARB NONE DETECTED 11/06/2014 1054    Alcohol Level:  Recent Labs Lab 11/06/14 1102  ETH <5   Urinalysis:  Recent Labs Lab 11/06/14 1054  COLORURINE YELLOW  LABSPEC 1.013  PHURINE 5.5  GLUCOSEU NEGATIVE  HGBUR SMALL*  BILIRUBINUR NEGATIVE  KETONESUR NEGATIVE  PROTEINUR 30*  UROBILINOGEN 0.2  NITRITE POSITIVE*  LEUKOCYTESUR LARGE*   Micro Results: Recent Results (from the past 240 hour(s))  MRSA PCR Screening     Status: None   Collection Time: 11/06/14  6:00 PM  Result Value Ref Range Status   MRSA by PCR NEGATIVE NEGATIVE Final    Comment:        The GeneXpert MRSA Assay (FDA approved for NASAL specimens only), is one component of a comprehensive MRSA colonization surveillance program. It is not intended to diagnose MRSA infection nor to guide or monitor treatment  for MRSA infections.    Studies/Results: Ct Head Wo Contrast  11/06/2014   CLINICAL DATA:  Unable to eat. Right-sided neck pain when trying to straighten.  EXAM: CT HEAD WITHOUT CONTRAST  CT CERVICAL SPINE WITHOUT CONTRAST  TECHNIQUE: Multidetector CT imaging of the head and cervical spine was performed following the standard protocol without intravenous contrast. Multiplanar CT image reconstructions of the cervical spine were also generated.  COMPARISON:  Head CT 07/04/2011  FINDINGS: CT HEAD FINDINGS  Skull and Sinuses:Negative for fracture or destructive process. The mastoids, middle ears, and imaged paranasal sinuses are clear.  Orbits: No acute abnormality.  Brain: No evidence of acute infarction, hemorrhage, hydrocephalus, or mass lesion/mass effect. Generalized brain atrophy with ventriculomegaly. Brain atrophy which is progressed from 2012. Ventriculomegaly is likely from central predominant volume loss. There is prominent mesial temporal lobe atrophy, pattern which can be seen with Alzheimer's disease. Chronic small vessel disease ischemic gliosis throughout the bilateral cerebral white matter, also progressed from prior.  CT CERVICAL SPINE FINDINGS  No fracture or subluxation. No erosion of the endplates with facets. No gross cervical canal hematoma or prevertebral edema. The muscular or ligamentous ossification.  Profound osteopenia. There is degenerative disc disease which is focally advanced at C5-6. Despite endplate spurs node notable canal stenosis. Facet ankylosis bilaterally at C4-5 and C5-6. There is advanced degeneration at the C1-C2 articulation with bulky spurring about the atlanto dental interval.  Bilateral submandibular sialolithiasis.  No acute inflammation.  Aberrant right subclavian artery, partly visualized.  IMPRESSION: 1. No acute intracranial or cervical spine findings. 2. Progressive brain atrophy and chronic small vessel disease.   Electronically Signed   By: Monte Fantasia  M.D.   On: 11/06/2014 12:01   Ct Cervical Spine Wo Contrast  11/06/2014   CLINICAL DATA:  Unable to eat. Right-sided neck pain when trying to straighten.  EXAM: CT HEAD WITHOUT CONTRAST  CT CERVICAL SPINE WITHOUT CONTRAST  TECHNIQUE: Multidetector CT imaging of the head and cervical spine was performed following the standard protocol without intravenous contrast. Multiplanar CT image reconstructions of the cervical spine were also generated.  COMPARISON:  Head CT 07/04/2011  FINDINGS: CT HEAD FINDINGS  Skull and Sinuses:Negative for fracture or destructive process. The mastoids, middle ears,  and imaged paranasal sinuses are clear.  Orbits: No acute abnormality.  Brain: No evidence of acute infarction, hemorrhage, hydrocephalus, or mass lesion/mass effect. Generalized brain atrophy with ventriculomegaly. Brain atrophy which is progressed from 2012. Ventriculomegaly is likely from central predominant volume loss. There is prominent mesial temporal lobe atrophy, pattern which can be seen with Alzheimer's disease. Chronic small vessel disease ischemic gliosis throughout the bilateral cerebral white matter, also progressed from prior.  CT CERVICAL SPINE FINDINGS  No fracture or subluxation. No erosion of the endplates with facets. No gross cervical canal hematoma or prevertebral edema. The muscular or ligamentous ossification.  Profound osteopenia. There is degenerative disc disease which is focally advanced at C5-6. Despite endplate spurs node notable canal stenosis. Facet ankylosis bilaterally at C4-5 and C5-6. There is advanced degeneration at the C1-C2 articulation with bulky spurring about the atlanto dental interval.  Bilateral submandibular sialolithiasis.  No acute inflammation.  Aberrant right subclavian artery, partly visualized.  IMPRESSION: 1. No acute intracranial or cervical spine findings. 2. Progressive brain atrophy and chronic small vessel disease.   Electronically Signed   By: Monte Fantasia M.D.    On: 11/06/2014 12:01   Mr Brain Wo Contrast  11/06/2014   CLINICAL DATA:  80 year old female with hypercholesterolemia presenting with increased confusion over the past week. Initial encounter.  EXAM: MRI HEAD WITHOUT CONTRAST  TECHNIQUE: Multiplanar, multiecho pulse sequences of the brain and surrounding structures were obtained without intravenous contrast.  COMPARISON:  Several prior CTs, most recent 11/06/2014 and most remote 09/01/2008.  FINDINGS: Exam is motion degraded.  No acute infarct.  No intracranial hemorrhage.  Moderate small vessel disease type changes.  Global prominent atrophy. Ventricular prominence may be related to atrophy although difficult to completely exclude a component of superimposed mild hydrocephalus. The degree of ventricular prominence has progressed since most remote exam of 2010.  No intracranial mass lesion noted on this unenhanced exam.  Major intracranial vascular structures are patent.  Cervical medullary junction, pituitary region, pineal region and orbital structures unremarkable.  IMPRESSION: Exam is motion degraded.  No acute infarct.  Moderate small vessel disease type changes.  Global prominent atrophy. Ventricular prominence may be related to atrophy although difficult to completely exclude a component of superimposed mild hydrocephalus. The degree of ventricular prominence has progressed since most remote exam of 2010.   Electronically Signed   By: Genia Del M.D.   On: 11/06/2014 16:52   Dg Chest Portable 1 View  11/06/2014   CLINICAL DATA:  Weakness and dysphagia.  Left facial droop.  EXAM: PORTABLE CHEST - 1 VIEW  COMPARISON:  PA and lateral chest 07/04/2011 and 08/04/2008.  FINDINGS: The lungs are clear. Heart size is normal. No pneumothorax or pleural effusion. Vertebral augmentation lower thoracic spine is again seen. Degenerative disease about the shoulders is worse on the right.  IMPRESSION: No acute disease.   Electronically Signed   By: Inge Rise  M.D.   On: 11/06/2014 11:07   Medications: I have reviewed the patient's current medications. Scheduled Meds: . antiseptic oral rinse  7 mL Mouth Rinse q12n4p  . cefTRIAXone (ROCEPHIN) IVPB 1 gram/50 mL D5W  1 g Intravenous Q24H  . chlorhexidine  15 mL Mouth Rinse BID  . sodium chloride  3 mL Intravenous Q12H   Continuous Infusions: . sodium chloride 75 mL/hr at 11/07/14 0912   PRN Meds:.ondansetron **OR** ondansetron (ZOFRAN) IV Assessment/Plan: Principal Problem:   Acute encephalopathy Active Problems:   Hypernatremia   AKI (acute kidney  injury)   UTI (urinary tract infection)   Atrial fibrillation with rapid ventricular response   Elevated AST (SGOT)   Hypercalcemia   Osteopenia   Hyperglycemia   GERD (gastroesophageal reflux disease)   Allergic rhinitis  81 yo female with hx of dementia here with worsening AMS and severe hypernatremia  Severe hypernatremia - likely 2/2 to low PO intake of water as she is demented and also was in a nursing home. Was very dry on exam. Could also have partial diabetic insipidus.  -total free water deficit on admission 8 liters.  - goal correction is 0.5 meq per hour, came in with sodium 174, now 175. Since she is volume depleted, will correct slowly with NS 75cc per hour. May switch to D5 1/2 normal later when volume corrected - Uosm 460 (lower than expected for volume depletion and higher than expected for DI), UNa 47 (higher than expected for volume depletion). May have a overlap of restricted water intake and partial DI - appreciate nephrology recs - continue q4hr bmet -strict i/o's, inserted foley for this.  AMS - likely 2/2 to severe hypernatremia  Could also be from seizure 2/2 to hypernatremia? - does have left facial droop but unsure if this is acute or chronic. - Ct head negative, MRI negative. Likely not stroke. -appreciate neuro rec.  - fix hypernatremia as above.  Questionable Afib with RVR on presentation - EKG unclear if it  was Afib. - was on dilt drip. Rate controlled and was in normal sinus rhythm within few hours of dilt drip. Unclear if has hx of afib. Not in afib currently or overnight. - was on heparin gtt . She has no stroke and anticoag has higher risk than benefit for her so will stop the heparin gtt. Her tachycardia was likely 2/2 to volume depletion which has improved now.   UTI - seen on UA. ucx pending.  - on ceftriaxone. F/up cultures.  Hypercalcemia - corrected was 11. Improved. - could be from immobilization, hypernatremia? Multiple myeloma panel pending.  Sick euthyroid syndrome - low tsh 0.168, normal T4 1.0  Macrocytosis - hemoglobin normal 12.9. Unclear what baseline is. - was 15.3 hgb initiailly, improved with IVF. - MCV 108. Folate and b12 normal. TSH low but T4 normal. Unclear of the cause of macrocytosis.  Full code Heparin dvt ppx Dysphagia 1 diet.    Dispo: Disposition is deferred at this time, awaiting improvement of current medical problems.  Anticipated discharge in approximately 1-2 day(s).   The patient does have a current PCP (No primary care provider on file.) and does need an [facility] hospital follow-up appointment after discharge.  The patient does have transportation limitations that hinder transportation to clinic appointments.  .Services Needed at time of discharge: Y = Yes, Blank = No PT:   OT:   RN:   Equipment:   Other:     LOS: 1 day   Dellia Nims, MD 11/07/2014, 11:51 AM

## 2014-11-08 DIAGNOSIS — D890 Polyclonal hypergammaglobulinemia: Secondary | ICD-10-CM | POA: Diagnosis present

## 2014-11-08 DIAGNOSIS — R10811 Right upper quadrant abdominal tenderness: Secondary | ICD-10-CM

## 2014-11-08 DIAGNOSIS — R4182 Altered mental status, unspecified: Secondary | ICD-10-CM

## 2014-11-08 DIAGNOSIS — I252 Old myocardial infarction: Secondary | ICD-10-CM

## 2014-11-08 DIAGNOSIS — E0590 Thyrotoxicosis, unspecified without thyrotoxic crisis or storm: Secondary | ICD-10-CM | POA: Diagnosis present

## 2014-11-08 DIAGNOSIS — Z66 Do not resuscitate: Secondary | ICD-10-CM

## 2014-11-08 DIAGNOSIS — R001 Bradycardia, unspecified: Secondary | ICD-10-CM

## 2014-11-08 DIAGNOSIS — G9341 Metabolic encephalopathy: Secondary | ICD-10-CM | POA: Diagnosis present

## 2014-11-08 DIAGNOSIS — I5189 Other ill-defined heart diseases: Secondary | ICD-10-CM | POA: Diagnosis present

## 2014-11-08 DIAGNOSIS — I4891 Unspecified atrial fibrillation: Secondary | ICD-10-CM

## 2014-11-08 LAB — URINE CULTURE

## 2014-11-08 LAB — BASIC METABOLIC PANEL
Anion gap: 7 (ref 5–15)
BUN: 35 mg/dL — ABNORMAL HIGH (ref 6–23)
BUN: 37 mg/dL — ABNORMAL HIGH (ref 6–23)
BUN: 41 mg/dL — ABNORMAL HIGH (ref 6–23)
BUN: 45 mg/dL — AB (ref 6–23)
BUN: 47 mg/dL — AB (ref 6–23)
BUN: 51 mg/dL — ABNORMAL HIGH (ref 6–23)
CO2: 23 mmol/L (ref 19–32)
CO2: 23 mmol/L (ref 19–32)
CO2: 24 mmol/L (ref 19–32)
CO2: 24 mmol/L (ref 19–32)
CO2: 24 mmol/L (ref 19–32)
CO2: 27 mmol/L (ref 19–32)
CREATININE: 1.47 mg/dL — AB (ref 0.50–1.10)
CREATININE: 1.31 mg/dL — AB (ref 0.50–1.10)
Calcium: 8.4 mg/dL (ref 8.4–10.5)
Calcium: 8.3 mg/dL — ABNORMAL LOW (ref 8.4–10.5)
Calcium: 8.5 mg/dL (ref 8.4–10.5)
Calcium: 8.2 mg/dL — ABNORMAL LOW (ref 8.4–10.5)
Calcium: 8.1 mg/dL — ABNORMAL LOW (ref 8.4–10.5)
Calcium: 7.9 mg/dL — ABNORMAL LOW (ref 8.4–10.5)
Chloride: >130 mmol/L (ref 96–112)
Chloride: 128 mmol/L — ABNORMAL HIGH (ref 96–112)
Creatinine, Ser: 1.5 mg/dL — ABNORMAL HIGH (ref 0.50–1.10)
Creatinine, Ser: 1.46 mg/dL — ABNORMAL HIGH (ref 0.50–1.10)
Creatinine, Ser: 1.49 mg/dL — ABNORMAL HIGH (ref 0.50–1.10)
Creatinine, Ser: 1.41 mg/dL — ABNORMAL HIGH (ref 0.50–1.10)
GFR calc Af Amer: 35 mL/min — ABNORMAL LOW (ref >90)
GFR calc non Af Amer: 31 mL/min — ABNORMAL LOW (ref >90)
GFR calc non Af Amer: 33 mL/min — ABNORMAL LOW (ref >90)
GFR calc non Af Amer: 36 mL/min — ABNORMAL LOW (ref >90)
GFR, EST AFRICAN AMERICAN: 36 mL/min — AB (ref >90)
GFR, EST AFRICAN AMERICAN: 35 mL/min — AB (ref >90)
GFR, EST AFRICAN AMERICAN: 36 mL/min — AB (ref >90)
GFR, EST AFRICAN AMERICAN: 38 mL/min — AB (ref >90)
GFR, EST AFRICAN AMERICAN: 41 mL/min — AB (ref >90)
GFR, EST NON AFRICAN AMERICAN: 30 mL/min — AB (ref >90)
GFR, EST NON AFRICAN AMERICAN: 31 mL/min — AB (ref >90)
GFR, EST NON AFRICAN AMERICAN: 31 mL/min — AB (ref >90)
GLUCOSE: 97 mg/dL (ref 70–99)
Glucose, Bld: 90 mg/dL (ref 70–99)
Glucose, Bld: 102 mg/dL — ABNORMAL HIGH (ref 70–99)
Glucose, Bld: 122 mg/dL — ABNORMAL HIGH (ref 70–99)
Glucose, Bld: 131 mg/dL — ABNORMAL HIGH (ref 70–99)
Glucose, Bld: 130 mg/dL — ABNORMAL HIGH (ref 70–99)
POTASSIUM: 3.3 mmol/L — AB (ref 3.5–5.1)
POTASSIUM: 3.4 mmol/L — AB (ref 3.5–5.1)
Potassium: 3.6 mmol/L (ref 3.5–5.1)
Potassium: 3.2 mmol/L — ABNORMAL LOW (ref 3.5–5.1)
Potassium: 3.2 mmol/L — ABNORMAL LOW (ref 3.5–5.1)
Potassium: 3.6 mmol/L (ref 3.5–5.1)
SODIUM: 163 mmol/L — AB (ref 135–145)
SODIUM: 169 mmol/L — AB (ref 135–145)
SODIUM: 170 mmol/L — AB (ref 135–145)
Sodium: 166 mmol/L (ref 135–145)
Sodium: 167 mmol/L (ref 135–145)
Sodium: 159 mmol/L — ABNORMAL HIGH (ref 135–145)

## 2014-11-08 LAB — GLUCOSE, CAPILLARY
GLUCOSE-CAPILLARY: 83 mg/dL (ref 70–99)
GLUCOSE-CAPILLARY: 110 mg/dL — AB (ref 70–99)
GLUCOSE-CAPILLARY: 113 mg/dL — AB (ref 70–99)
Glucose-Capillary: 98 mg/dL (ref 70–99)
Glucose-Capillary: 135 mg/dL — ABNORMAL HIGH (ref 70–99)

## 2014-11-08 LAB — CBC
HEMATOCRIT: 41.6 % (ref 36.0–46.0)
Hemoglobin: 12.3 g/dL (ref 12.0–15.0)
MCH: 31.6 pg (ref 26.0–34.0)
MCHC: 29.6 g/dL — ABNORMAL LOW (ref 30.0–36.0)
MCV: 106.9 fL — ABNORMAL HIGH (ref 78.0–100.0)
Platelets: 171 10*3/uL (ref 150–400)
RBC: 3.89 MIL/uL (ref 3.87–5.11)
RDW: 13.9 % (ref 11.5–15.5)
WBC: 13.9 10*3/uL — ABNORMAL HIGH (ref 4.0–10.5)

## 2014-11-08 LAB — OSMOLALITY, URINE: Osmolality, Ur: 410 mOsm/kg (ref 390–1090)

## 2014-11-08 LAB — SODIUM, URINE, RANDOM: Sodium, Ur: 101 mmol/L

## 2014-11-08 LAB — PARATHYROID HORMONE, INTACT (NO CA)

## 2014-11-08 MED ORDER — DEXTROSE 5 % IV SOLN
INTRAVENOUS | Status: DC
  Administered 2014-11-08: 08:00:00 via INTRAVENOUS

## 2014-11-08 MED ORDER — SODIUM CHLORIDE 0.9 % IJ SOLN
10.0000 mL | INTRAMUSCULAR | Status: DC | PRN
  Administered 2014-11-10 – 2014-11-11 (×2): 10 mL
  Administered 2014-11-11: 30 mL
  Filled 2014-11-08 (×3): qty 40

## 2014-11-08 MED ORDER — POTASSIUM CHLORIDE 10 MEQ/100ML IV SOLN
10.0000 meq | INTRAVENOUS | Status: AC
  Administered 2014-11-08 (×3): 10 meq via INTRAVENOUS
  Filled 2014-11-08 (×3): qty 100

## 2014-11-08 MED ORDER — SODIUM CHLORIDE 0.9 % IJ SOLN
10.0000 mL | Freq: Two times a day (BID) | INTRAMUSCULAR | Status: DC
  Administered 2014-11-10: 30 mL

## 2014-11-08 MED ORDER — POTASSIUM CHLORIDE 10 MEQ/100ML IV SOLN
10.0000 meq | INTRAVENOUS | Status: AC
  Administered 2014-11-08 (×3): 10 meq via INTRAVENOUS
  Filled 2014-11-08 (×2): qty 100

## 2014-11-08 MED ORDER — POTASSIUM CL IN DEXTROSE 5% 20 MEQ/L IV SOLN
20.0000 meq | INTRAVENOUS | Status: DC
  Administered 2014-11-09: 20 meq via INTRAVENOUS
  Filled 2014-11-08 (×2): qty 1000

## 2014-11-08 MED ORDER — POTASSIUM CL IN DEXTROSE 5% 20 MEQ/L IV SOLN
20.0000 meq | INTRAVENOUS | Status: DC
Start: 2014-11-08 — End: 2014-11-08
  Administered 2014-11-08: 20 meq via INTRAVENOUS
  Filled 2014-11-08 (×3): qty 1000

## 2014-11-08 NOTE — Progress Notes (Signed)
Admit: 11/06/2014 LOS: 2  36F with hypernatremia, dementia in SNF, UTI  Subjective:  U Cx with >100k GNRs Serum Na improving Not polyuria   04/26 0701 - 04/27 0700 In: 2152.5 [I.V.:2102.5; IV Piggyback:50] Out: 750 [Urine:750]  Filed Weights   11/06/14 1747 11/07/14 0500 11/08/14 0500  Weight: 55.3 kg (121 lb 14.6 oz) 55.9 kg (123 lb 3.8 oz) 57.7 kg (127 lb 3.3 oz)    Scheduled Meds: . antiseptic oral rinse  7 mL Mouth Rinse q12n4p  . cefTRIAXone (ROCEPHIN) IVPB 1 gram/50 mL D5W  1 g Intravenous Q24H  . chlorhexidine  15 mL Mouth Rinse BID  . feeding supplement (RESOURCE BREEZE)  1 Container Oral TID BM  . heparin subcutaneous  5,000 Units Subcutaneous 3 times per day  . potassium chloride  10 mEq Intravenous Q1 Hr x 3  . sodium chloride  3 mL Intravenous Q12H   Continuous Infusions: . dextrose 125 mL/hr at 11/08/14 0820   PRN Meds:.ondansetron **OR** ondansetron (ZOFRAN) IV  Current Labs: reviewed    Physical Exam:  Blood pressure 137/64, pulse 52, temperature 98.1 F (36.7 C), temperature source Axillary, resp. rate 10, height 5\' 2"  (1.575 m), weight 57.7 kg (127 lb 3.3 oz), SpO2 93 %. GEN: awake, not interactive ENT: Dry MM EYES: EOMI, sunken CV: IRIR, soft murmur, systolic PULM: CTAB ABD: s/nt/nd SKIN: no rashes/lesions EXT:no edema  A/P 1. Hypernatremia 1. Likely impaired free water access and inability to maximally concentrate urine 2/2 age and ? CKD (suspect present based on Renal US findings); can't entirely rule out partial DI but I doubt present -- esp without polyuria 2. IVF to D5W at 16425mL/hr 3. Cont q4h Serum Na levels 4. Goal correction of serum Na is no greater than 12 in 24h 5. Agree with Foley Catheter 6. Will place PICC to facilitate IVFs and lab draw 7. Provide liberal access to water -- apparently she will drink if offered but will not obtain on her own 2. AKI vs CKD: no baseline data: 1. follow with time 2. Renal US 3. Eval for monoclonal  proces 3. Dementia at baseline, in SNF 4. UTI on ceftriaxone, U and B Cx pending  1. Today U Cx with 100k GNRs 5. AFib with some RVR upon presentation  Sabra Heckyan Dylan Monforte MD 11/08/2014, 11:02 AM   Recent Labs Lab 11/06/14 1332  11/07/14 2330 11/08/14 0234 11/08/14 0605  NA  --   < > 170* 166* 167*  K  --   < > 3.6 3.2* 3.2*  CL  --   < > >130* >130* >130*  CO2  --   < > 24 23 23   GLUCOSE  --   < > 90 102* 97  BUN  --   < > 51* 47* 45*  CREATININE  --   < > 1.50* 1.46* 1.49*  CALCIUM  --   < > 8.4 8.3* 8.5  PHOS 2.8  --   --   --   --   < > = values in this interval not displayed.  Recent Labs Lab 11/06/14 1102 11/06/14 1118 11/07/14 0138 11/08/14 0334  WBC 15.6*  --  15.5* 13.9*  NEUTROABS 12.5*  --   --   --   HGB 15.3* 16.7* 12.9 12.3  HCT 49.8* 49.0* 43.9 41.6  MCV 105.7*  --  108.1* 106.9*  PLT 224  --  186 171

## 2014-11-08 NOTE — Consult Note (Signed)
CARDIOLOGY CONSULT NOTE   Patient ID: Molly Jimenez MRN: [id number] DOB/AGE: 1926-04-03 81 y.o.  Admit date: 11/06/2014  Primary Physician   No primary care provider on file. Primary Cardiologist  None Reason for Consultation Bradycardia  Molly Jimenez is an 81 y.o. year old female with a history of renal disorder, GERD, HLD, and dementia admitted 04/25 from an assisted living facility with altered mental status and increased confusion.   Found to have afib with RVR (initiated on cardizem drip with subsequent conversion to SR), severe hypernatremia (peak sodium 177), dehydration, UTI (E. coli on urine culture), leukocytosis (WBC 15.6), and possible stroke. CT head revealed generalized brain atrophy with ventriculomegaly and chronic small vessel disease; no acute infarction, hemorrhage, hydrocephalus, or mass lesion. MRI negative for acute stroke. Initially on heparin drip, which was discontinued given risk > benefit.  Cardiology consulted given bradycardia to low 30s on telemetry.   On encounter with [name] this afternoon, she was not interactive and did not respond to questions or follow commands. She continued to state "go away, let me sleep".     Past Medical History  Diagnosis Date  . Renal disorder   . GERD (gastroesophageal reflux disease)   . Hypercholesteremia      History reviewed. No pertinent past surgical history.  No Known Allergies  I have reviewed the patient's current medications . antiseptic oral rinse  7 mL Mouth Rinse q12n4p  . cefTRIAXone (ROCEPHIN) IVPB 1 gram/50 mL D5W  1 g Intravenous Q24H  . chlorhexidine  15 mL Mouth Rinse BID  . feeding supplement (RESOURCE BREEZE)  1 Container Oral TID BM  . heparin subcutaneous  5,000 Units Subcutaneous 3 times per day  . sodium chloride  3 mL Intravenous Q12H   . dextrose 125 mL/hr at 11/08/14 0820   ondansetron **OR** ondansetron (ZOFRAN) IV  Medication Sig  acetaminophen (TYLENOL)  500 MG tablet Take 500 mg by mouth 3 (three) times daily as needed (pain).  aspirin 81 MG chewable tablet Chew 81 mg by mouth every morning.  atorvastatin (LIPITOR) 10 MG tablet Take 10 mg by mouth every morning.  betamethasone dipropionate (DIPROLENE) 0.05 % cream See admin instructions. Apply a thin layer topically to areas of redness/scaling on ankle, knees, back of ands, and low back twice daily as needed for redness/scaling  Calcium Carbonate-Vitamin D 600-400 MG-UNIT per tablet Take 1 tablet by mouth 2 (two) times daily.  citalopram (CELEXA) 20 MG tablet Take 20 mg by mouth every morning.  feeding supplement (BOOST HIGH PROTEIN) LIQD Take 1 Container by mouth every morning.  HYDROcodone-acetaminophen (NORCO/VICODIN) 5-325 MG per tablet Take 0.5 tablets by mouth 2 (two) times daily.  ketoconazole (NIZORAL) 2 % shampoo See admin instructions. Use as directed three times weekly (Monday, Wednesday, and Friday) with shower  loratadine (CLARITIN) 10 MG tablet Take 10 mg by mouth daily as needed for allergies.  memantine (NAMENDA) 10 MG tablet Take 10 mg by mouth 2 (two) times daily.  Multiple Vitamins-Minerals (CENTRUM SILVER PO) Take 1 tablet by mouth every morning.  omeprazole (PRILOSEC) 20 MG capsule Take 20 mg by mouth daily as needed (heartburn).  OVER THE COUNTER MEDICATION Take 4 oz by mouth every 2 (two) hours. Encourage 4oz fluids  polyethylene glycol (MIRALAX / GLYCOLAX) packet Take 17 g by mouth every morning.  triamcinolone cream (KENALOG) 0.1 % See admin instructions. Apply to affected areas of rash at bedtime     History   Social  History  . Marital Status: Single    Spouse Name: N/A  . Number of Children: N/A  . Years of Education: N/A   Occupational History  . Not on file.   Social History Main Topics  . Smoking status: Former Games developer  . Smokeless tobacco: Not on file  . Alcohol Use: No  . Drug Use: Not on file  . Sexual Activity: Not on file   Other Topics Concern    . Not on file   Social History Narrative    No family status information on file.   History reviewed. No pertinent family history.   ROS:  Full 14 point review of systems complete and found to be negative unless listed above.  Physical Exam: Blood pressure 120/52, pulse 52, temperature 97.7 F (36.5 C), temperature source Axillary, resp. rate 18, height  (1.575 m), weight 127 lb 3.3 oz (57.7 kg), SpO2 95 %.  General: Well developed female in no acute distress, resting in bed. Head: PERRL. Normocephalic and atraumatic. Lungs: Poor inspiratory effort. CTA bilaterally. No wheezing, rhonchi, or rales.  Heart: Bradycardia, regular rhythm, S1 S2, no rub/gallop, soft systolic murmur. Pulses are 2+ in all extrem.   Neck: No lymphadenopathy. JVD not elevated. Abdomen: Bowel sounds present, abdomen soft, non-distended, non-tender. No masses or hernias noted. Msk: No joint deformities or effusions. Extremities: No clubbing or cyanosis. No edema. Wrap applied to right wrist. Neuro: Alert, unable to assess orientation as patient does not respond to questions. Does not follow commands.  Skin: No rashes or lesions noted.  Labs:   Lab Results  Component Value Date   WBC 13.9* 11/08/2014   HGB 12.3 11/08/2014   HCT 41.6 11/08/2014   MCV 106.9* 11/08/2014   PLT 171 11/08/2014    Recent Labs  11/06/14 1102  INR 1.18    Recent Labs Lab 11/07/14 0138  11/08/14 0930  NA 174*  < > 169*  K 3.4*  < > 3.3*  CL >130*  < > >130*  CO2 25  < > 27  BUN 53*  < > 41*  CREATININE 1.60*  < > 1.47*  CALCIUM 8.8  < > 8.2*  PROT 5.9*  --   --   BILITOT 0.9  --   --   ALKPHOS 59  --   --   ALT 33  --   --   AST 36  --   --   GLUCOSE 105*  < > 122*  ALBUMIN 2.6*  --   --   < > = values in this interval not displayed. MAGNESIUM  Date Value Ref Range Status  11/07/2014 2.5 1.5 - 2.5 mg/dL Final    Recent Labs  91/47/82 1332 11/06/14 1840 11/07/14 0138  CKTOTAL  --  55  --    TROPONINI 0.06* 0.07* 0.06*    Recent Labs  11/06/14 1116  TROPIPOC 0.04    Lab Results  Component Value Date   CHOL 147 11/06/2014   HDL 30* 11/06/2014   LDLCALC 96 11/06/2014   TRIG 104 11/06/2014   TSH  Date/Time Value Ref Range Status  11/06/2014 01:33 PM 0.168* 0.350 - 4.500 uIU/mL Final   FREE T4  Date Value Ref Range Status  11/06/2014 1.00 0.80 - 1.80 ng/dL Final    Comment:    Performed at [facility]   VITAMIN B-12  Date/Time Value Ref Range Status  11/06/2014 06:40 PM 1202* 211 - 911 pg/mL Final    Comment:  Performed at [facility]   FOLATE  Date/Time Value Ref Range Status  11/06/2014 06:40 PM >20.0 ng/mL Final    Comment:    (NOTE) Reference Ranges        Deficient:       0.4 - 3.3 ng/mL        Indeterminate:   3.4 - 5.4 ng/mL        Normal:              > 5.4 ng/mL Performed at [facility]     Echo: 11/07/14 Study Conclusions Left ventricle: The cavity size was normal. Systolic function was normal. The estimated ejection fraction was in the range of 60% to 65%. Wall motion was normal; there were no regional wall motion abnormalities. Doppler parameters are consistent with abnormal left ventricular relaxation (grade 1 diastolic dysfunction). There was no evidence of elevated ventricular filling pressure by Doppler parameters. Aortic valve: Trileaflet; normal thickness leaflets. There was no regurgitation. Aortic root: The aortic root was normal in size. Mitral valve: Structurally normal valve. There was mild regurgitation. Left atrium: The atrium was normal in size. Right ventricle: Systolic function was normal. Right atrium: The atrium was normal in size. Tricuspid valve: There was moderate regurgitation. Pulmonary arteries: Systolic pressure was within the normal range. PA peak pressure: 33 mm Hg (S). Inferior vena cava: The vessel was normal in size. Pericardium, extracardiac: There was no pericardial  effusion.  ECG:  Sinus bradycardia HR 53, PACs, anterolateral T wave abnormality  Radiology:   Mr Sherrin Daisy Contrast 11/06/2014   CLINICAL DATA:  81 year old female with hypercholesterolemia presenting with increased confusion over the past week. Initial encounter.  EXAM: MRI HEAD WITHOUT CONTRAST  TECHNIQUE: Multiplanar, multiecho pulse sequences of the brain and surrounding structures were obtained without intravenous contrast.  COMPARISON:  Several prior CTs, most recent 11/06/2014 and most remote 09/01/2008.  FINDINGS: Exam is motion degraded.  No acute infarct.  No intracranial hemorrhage.  Moderate small vessel disease type changes.  Global prominent atrophy. Ventricular prominence may be related to atrophy although difficult to completely exclude a component of superimposed mild hydrocephalus. The degree of ventricular prominence has progressed since most remote exam of 2010.  No intracranial mass lesion noted on this unenhanced exam.  Major intracranial vascular structures are patent.  Cervical medullary junction, pituitary region, pineal region and orbital structures unremarkable.  IMPRESSION: Exam is motion degraded.  No acute infarct.  Moderate small vessel disease type changes.  Global prominent atrophy. Ventricular prominence may be related to atrophy although difficult to completely exclude a component of superimposed mild hydrocephalus. The degree of ventricular prominence has progressed since most remote exam of 2010.   Electronically Signed   By: Lacy Duverney M.D.   On: 11/06/2014 16:52   US Renal 11/07/2014   CLINICAL DATA:  Rising creatinine.  Renal disorder.  EXAM: RENAL / URINARY TRACT ULTRASOUND COMPLETE  COMPARISON:  None.  FINDINGS: Right Kidney:  Length: 9.7 cm. Renal cortical thinning with increased renal cortical echogenicity. No mass or hydronephrosis visualized.  Left Kidney:  Length: 9.7 cm. Renal cortical thinning with increased renal cortical echogenicity. No mass or  hydronephrosis visualized.  Bladder:  Decompressed bladder with a Foley catheter present.  IMPRESSION: 1. No obstructive uropathy. 2. Bilateral renal cortical thinning and increased renal cortical echogenicity as can be seen with medical renal disease.   Electronically Signed   By: Elige Ko   On: 11/07/2014 19:41    ASSESSMENT AND  PLAN:   The patient was seen today by Dr. Mayford Knifeurner, the patient evaluated and the data reviewed.   Principal Problem:   Acute encephalopathy - per IM  Active Problems:  Bradycardia - echo 04/26 EF 60% to 65%, no WMA - bradycardic with HR to low 30s on telemetry; SBP 120-137, stable  - not obviously symptomatic, patient alert but not interactive regardless of HR - not currently on any AV nodal blocking agents    Atrial fibrillation with rapid ventricular response - ECG on admission afib, HR 144 - initiated on cardizem drip, spontaneously converted to SR - was on heparin drip, discontinued as risk thought to outweigh benefit (per IM)   Diastolic dysfunction - echo 04/26 EF 60% to 65%, no RWMA, grade 1 diastolic dysfunction   Otherwies, per IM/Nephrology     Hypernatremia   Hypokalemia   AKI (acute kidney injury)   UTI (urinary tract infection)   Elevated AST (SGOT)   Hypercalcemia   Osteopenia   Prediabetes   GERD (gastroesophageal reflux disease)   Allergic rhinitis   Subclinical hyperthyroidism  Plan: Will follow on telemetry. ECG concerning for possible coronary ischemia; however, given current mental status, would not pursue further evaluation unless mental status improves significantly. Normal EF with no WMA on echo is reassuring.     SignedTheodore Demark: Rhonda Barrett, PA-C 11/08/2014 2:08 PM Beeper [phone]  Co-Sign MD

## 2014-11-08 NOTE — Progress Notes (Signed)
Patient ID: Molly JacksonGrace E Jimenez, female   DOB: [birth year], 81 y.o.   MRN: [id number] Medicine attending: We greatly appreciate nephrology input. 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 She was initially started on isotonic fluids until volume was repleted. Urine output improving. She was then changed to hypotonic fluids initially D5 half-normal saline. Sodium has come down from 177to 167 which is an optimal change in the first 24 hours of fluid replacement. She is nonverbal unless stimulated by our exam. She does not like to be examined. She still appears tender in the right upper quadrant. No fever. No elevated liver functions. Heart monitor shows bradycardia down to rate of 30 at times. Rhythm is sinus. Evidence for previous myocardial damage with Q waves in leads II, III, and F V1 and V2. We will ask for cardiology opinion but she is likely not a candidate for a pacemaker. Thyroid functions with suppressed TSH but normal free T4. Consistent with chronic illness. Mild elevation of serum IgA in a polyclonal pattern. She does have a state appointed guardian. We have confirmed that she is a DO NOT RESUSCITATE status. She has no family. We will continue supportive care.

## 2014-11-08 NOTE — Progress Notes (Signed)
Subjective:  Continues to be disoriented. Afebrile overnight. Sodium correcting slowly 167 this am. Intermittently has bradycardia into 30's, EKG shows PACs, first degree heart block.  Objective: Vital signs in last 24 hours: Filed Vitals:   11/08/14 0500 11/08/14 0517 11/08/14 0734 11/08/14 1100  BP:  137/52 137/64 120/52  Pulse:   52 52  Temp:  98.3 F (36.8 C) 98.1 F (36.7 C) 97.7 F (36.5 C)  TempSrc:  Axillary Axillary Axillary  Resp:  Height:      Weight: 127 lb 3.3 oz (57.7 kg)     SpO2:  96% 93% 95%   Weight change: 5 lb 4.7 oz (2.4 kg)  Intake/Output Summary (Last 24 hours) at 11/08/14 1454 Last data filed at 11/08/14 1404  Gross per 24 hour  Intake 2342.5 ml  Output   1275 ml  Net 1067.5 ml   Vitals reviewed. General: resting in bed, confused, follows some commands.  HEENT: PERRL, EOMI, no scleral icterus. Oral mucosa more moist  Cardiac: RRR, no rubs, murmurs or gallops Pulm: clear to auscultation bilaterally, no wheezes, rales, or rhonchi Abd: soft, nontender, nondistended, BS present Ext: warm and well perfused, no pedal edema Neuro: alert and oriented to self only, mild left sided facial droop. Moves all ext equally. Down going babinski.   Lab Results: Basic Metabolic Panel:  Recent Labs Lab 11/06/14 1332  11/07/14 0138  11/08/14 0605 11/08/14 0930  NA  --   < > 174*  < > 167* 169*  K  --   < > 3.4*  < > 3.2* 3.3*  CL  --   < > >130*  < > >130* >130*  CO2  --   < > 25  < > 23 27  GLUCOSE  --   < > 105*  < > 97 122*  BUN  --   < > 53*  < > 45* 41*  CREATININE  --   < > 1.60*  < > 1.49* 1.47*  CALCIUM  --   < > 8.8  < > 8.5 8.2*  MG 3.0*  --  2.5  --   --   --   PHOS 2.8  --   --   --   --   --   < > = values in this interval not displayed. Liver Function Tests:  Recent Labs Lab 11/06/14 1102 11/07/14 0138  AST 40* 36  ALT 31 33  ALKPHOS 70 59  BILITOT 1.1 0.9  PROT 7.8 5.9*  ALBUMIN 3.3* 2.6*   No results for input(s):  LIPASE, AMYLASE in the last 168 hours. No results for input(s): AMMONIA in the last 168 hours. CBC:  Recent Labs Lab 11/06/14 1102  11/07/14 0138 11/08/14 0334  WBC 15.6*  --  15.5* 13.9*  NEUTROABS 12.5*  --   --   --   HGB 15.3*  < > 12.9 12.3  HCT 49.8*  < > 43.9 41.6  MCV 105.7*  --  108.1* 106.9*  PLT 224  --  186 171  < > = values in this interval not displayed. Cardiac Enzymes:  Recent Labs Lab 11/06/14 1332 11/06/14 1840 11/07/14 0138  CKTOTAL  --  55  --   TROPONINI 0.06* 0.07* 0.06*   CBG:  Recent Labs Lab 11/07/14 1542 11/07/14 2010 11/07/14 2330 11/08/14 0545 11/08/14 0858 11/08/14 1206  GLUCAP 153* 171* 73 83 98 135*   Hemoglobin A1C:  Recent Labs Lab 11/06/14 1333  HGBA1C 5.7*   Fasting Lipid Panel:  Recent Labs Lab 11/06/14 1335  CHOL 147  HDL 30*  LDLCALC 96  TRIG 960  CHOLHDL 4.9   Thyroid Function Tests:  Recent Labs Lab 11/06/14 1333 11/06/14 1840  TSH 0.168*  --   FREET4  --  1.00   Coagulation:  Recent Labs Lab 11/06/14 1102  LABPROT 15.1  INR 1.18   Anemia Panel:  Recent Labs Lab 11/06/14 1840  VITAMINB12 1202*  FOLATE >20.0   Urine Drug Screen: Drugs of Abuse     Component Value Date/Time   LABOPIA POSITIVE* 11/06/2014 1054   COCAINSCRNUR NONE DETECTED 11/06/2014 1054   LABBENZ NONE DETECTED 11/06/2014 1054   AMPHETMU NONE DETECTED 11/06/2014 1054   THCU NONE DETECTED 11/06/2014 1054   LABBARB NONE DETECTED 11/06/2014 1054    Alcohol Level:  Recent Labs Lab 11/06/14 1102  ETH <5   Urinalysis:  Recent Labs Lab 11/06/14 1054  COLORURINE YELLOW  LABSPEC 1.013  PHURINE 5.5  GLUCOSEU NEGATIVE  HGBUR SMALL*  BILIRUBINUR NEGATIVE  KETONESUR NEGATIVE  PROTEINUR 30*  UROBILINOGEN 0.2  NITRITE POSITIVE*  LEUKOCYTESUR LARGE*   Micro Results: Recent Results (from the past 240 hour(s))  Urine culture     Status: None   Collection Time: 11/06/14 10:54 AM  Result Value Ref Range Status    Specimen Description URINE, RANDOM  Final   Special Requests NONE  Final   Colony Count   Final    >=100,000 COLONIES/ML Performed at [facility]    Culture   Final    ESCHERICHIA COLI Performed at [facility]    Report Status 11/08/2014 FINAL  Final   Organism ID, Bacteria ESCHERICHIA COLI  Final      Susceptibility   Escherichia coli - MIC*    AMPICILLIN <=2 SENSITIVE Sensitive     CEFAZOLIN <=4 SENSITIVE Sensitive     CEFTRIAXONE <=1 SENSITIVE Sensitive     CIPROFLOXACIN <=0.25 SENSITIVE Sensitive     GENTAMICIN <=1 SENSITIVE Sensitive     LEVOFLOXACIN <=0.12 SENSITIVE Sensitive     NITROFURANTOIN <=16 SENSITIVE Sensitive     TOBRAMYCIN <=1 SENSITIVE Sensitive     TRIMETH/SULFA <=20 SENSITIVE Sensitive     PIP/TAZO <=4 SENSITIVE Sensitive     * ESCHERICHIA COLI  MRSA PCR Screening     Status: None   Collection Time: 11/06/14  6:00 PM  Result Value Ref Range Status   MRSA by PCR NEGATIVE NEGATIVE Final    Comment:        The GeneXpert MRSA Assay (FDA approved for NASAL specimens only), is one component of a comprehensive MRSA colonization surveillance program. It is not intended to diagnose MRSA infection nor to guide or monitor treatment for MRSA infections.   Culture, blood (routine x 2)     Status: None (Preliminary result)   Collection Time: 11/06/14  6:30 PM  Result Value Ref Range Status   Specimen Description BLOOD LEFT ANTECUBITAL  Final   Special Requests BOTTLES DRAWN AEROBIC AND ANAEROBIC 5CC  Final   Culture   Final           BLOOD CULTURE RECEIVED NO GROWTH TO DATE CULTURE WILL BE HELD FOR 5 DAYS BEFORE ISSUING A FINAL NEGATIVE REPORT Performed at [facility]    Report Status PENDING  Incomplete  Culture, blood (routine x 2)     Status: None (Preliminary result)   Collection Time: 11/06/14  6:40 PM  Result Value Ref  Range Status   Specimen Description BLOOD LEFT HAND  Final   Special Requests BOTTLES DRAWN AEROBIC AND  ANAEROBIC 5CC  Final   Culture   Final           BLOOD CULTURE RECEIVED NO GROWTH TO DATE CULTURE WILL BE HELD FOR 5 DAYS BEFORE ISSUING A FINAL NEGATIVE REPORT Performed at [facility]    Report Status PENDING  Incomplete   Studies/Results: Mr Brain Wo Contrast  11/06/2014   CLINICAL DATA:  81 year old female with hypercholesterolemia presenting with increased confusion over the past week. Initial encounter.  EXAM: MRI HEAD WITHOUT CONTRAST  TECHNIQUE: Multiplanar, multiecho pulse sequences of the brain and surrounding structures were obtained without intravenous contrast.  COMPARISON:  Several prior CTs, most recent 11/06/2014 and most remote 09/01/2008.  FINDINGS: Exam is motion degraded.  No acute infarct.  No intracranial hemorrhage.  Moderate small vessel disease type changes.  Global prominent atrophy. Ventricular prominence may be related to atrophy although difficult to completely exclude a component of superimposed mild hydrocephalus. The degree of ventricular prominence has progressed since most remote exam of 2010.  No intracranial mass lesion noted on this unenhanced exam.  Major intracranial vascular structures are patent.  Cervical medullary junction, pituitary region, pineal region and orbital structures unremarkable.  IMPRESSION: Exam is motion degraded.  No acute infarct.  Moderate small vessel disease type changes.  Global prominent atrophy. Ventricular prominence may be related to atrophy although difficult to completely exclude a component of superimposed mild hydrocephalus. The degree of ventricular prominence has progressed since most remote exam of 2010.   Electronically Signed   By: Lacy Duverney M.D.   On: 11/06/2014 16:52   US Renal  11/07/2014   CLINICAL DATA:  Rising creatinine.  Renal disorder.  EXAM: RENAL / URINARY TRACT ULTRASOUND COMPLETE  COMPARISON:  None.  FINDINGS: Right Kidney:  Length: 9.7 cm. Renal cortical thinning with increased renal cortical  echogenicity. No mass or hydronephrosis visualized.  Left Kidney:  Length: 9.7 cm. Renal cortical thinning with increased renal cortical echogenicity. No mass or hydronephrosis visualized.  Bladder:  Decompressed bladder with a Foley catheter present.  IMPRESSION: 1. No obstructive uropathy. 2. Bilateral renal cortical thinning and increased renal cortical echogenicity as can be seen with medical renal disease.   Electronically Signed   By: Elige Ko   On: 11/07/2014 19:41   Medications: I have reviewed the patient's current medications. Scheduled Meds: . antiseptic oral rinse  7 mL Mouth Rinse q12n4p  . cefTRIAXone (ROCEPHIN) IVPB 1 gram/50 mL D5W  1 g Intravenous Q24H  . chlorhexidine  15 mL Mouth Rinse BID  . feeding supplement (RESOURCE BREEZE)  1 Container Oral TID BM  . heparin subcutaneous  5,000 Units Subcutaneous 3 times per day  . sodium chloride  3 mL Intravenous Q12H   Continuous Infusions: . dextrose 125 mL/hr at 11/08/14 0820   PRN Meds:.ondansetron **OR** ondansetron (ZOFRAN) IV Assessment/Plan: Principal Problem:   Acute encephalopathy Active Problems:   Hypernatremia   AKI (acute kidney injury)   UTI (urinary tract infection)   Atrial fibrillation with rapid ventricular response   Elevated AST (SGOT)   Hypercalcemia   Osteopenia   Prediabetes   GERD (gastroesophageal reflux disease)   Allergic rhinitis   Diastolic dysfunction   Subclinical hyperthyroidism  81 yo female with hx of dementia here with worsening AMS and severe hypernatremia  Severe hypernatremia - likely 2/2 to restricted PO intake of water as she is  demented and also was in a nursing home. Was very dry on exam. Could also have partial diabetic insipidus as well.  Uosm 460 (lower than expected for volume depletion but could have decreased ability to maximally concentrate urine due to age. It is also higher than expected for DI. UNa 47 (higher than expected for volume depletion). May have a overlap  of restricted water intake and partial DI. UOP 750 yesterday, likely not having polyuria. Una 101, Uosm 410 today.-total free water deficit on admission 8 liters.  - goal correction is 0.5 meq per hour, came in with sodium 174, now 166. Was initially volume resuscitated with Norma saline > half normal >> d5W 125cc/hr currently. Added Kcl 20meq to fluid. -  appreciate nephrology recs - continue q4hr bmet - strict i/o's, inserted foley for this.  AMS - likely 2/2 to severe hypernatremia  Could also be from seizure 2/2 to hypernatremia? - does have left facial droop but unsure if this is acute or chronic. - Ct head negative, MRI negative. Likely not stroke. -appreciate neuro rec.  - fix hypernatremia as above.  Intermittent bradycardia as low as 20 BPM's with first degree block and PVC - consulted cardiology patient is DNR and with her poor prognosis she may not be a good candidate for pace maker. - f/up card recs.  Questionable Afib with RVR on presentation - resolved. EKG unclear if it was truly Afib. - was on dilt drip and heparin gtt . Both d/ced. Initially tachycardia was likely 2/2 to dehdyration.  - echo normal EF 60-65%, grade 1 diastolic dysfunction.   UTI - seen on UA. ucx pending.  - on ceftriaxone. F/up cultures (Gram neg rods).  Hypercalcemia - corrected was 11. Improved. - could be from immobilization, hypernatremia?  Sick euthyroid syndrome - low tsh 0.168, normal T4 1.0  Macrocytosis - hemoglobin normal 12.9. Unclear what baseline is. - was 15.3 hgb initiailly, improved with IVF. - MCV 108. Folate and b12 normal. TSH low but T4 normal. Unclear of the cause of macrocytosis.  DNR - confirmed with her state appointed guardian Desire Rogelio SeenMcCall [phone] or [phone].  Heparin dvt ppx Dysphagia 3 diet.    Dispo: Disposition is deferred at this time, awaiting improvement of current medical problems.  Anticipated discharge in approximately 1-2 day(s).   The patient  does have a current PCP (No primary care provider on file.) and [facility] after [facility].  The patient does have transportation limitations that hinder transportation to clinic appointments.  .Services Needed at time of discharge: Y = Yes, Blank = No PT:   OT:   RN:   Equipment:   Other:     LOS: 2 days   Hyacinth Meekerasrif Lanita Stammen, MD 11/08/2014, 2:54 PM

## 2014-11-08 NOTE — Progress Notes (Signed)
Peripherally Inserted Central Catheter/Midline Placement  The IV Nurse has discussed with the patient and/or persons authorized to consent for the patient, the purpose of this procedure and the potential benefits and risks involved with this procedure.  The benefits include less needle sticks, lab draws from the catheter and patient may be discharged home with the catheter.  Risks include, but not limited to, infection, bleeding, blood clot (thrombus formation), and puncture of an artery; nerve damage and irregular heat beat.  Alternatives to this procedure were also discussed.  Medical Necessity consent given by Dr. Glenard HaringModing due to patient's altered mental status.  PICC/Midline Placement Documentation        Ruthvik Barnaby, Lajean ManesKerry Loraine 11/08/2014, 9:42 PM

## 2014-11-08 NOTE — Progress Notes (Signed)
Spoke to her state appointed guardian Ms. Desire McCall. Patient does not have any family members. She and her team makes all of her medical decisions. Patient is not very talkative at baseline. Is hard of hearing. When talked, responds in 1-2 words. Fully dependant for her activities.   Ms. Rogelio SeenMcCall confirmed that patient should be "DNR". I changed the code status. She stated she can sign consents.  Her contact info:  [phone]   I changed the code status to DNR.

## 2014-11-08 NOTE — Progress Notes (Signed)
CRITICAL VALUE ALERT  Critical value received:  Sodium 163, Chloride >130  Date of notification:  11/08/14  Time of notification:  16:15  Critical value read back: yes  Nurse who received alert:  Valli GlanceJillian   MD notified (1st page): IMTS resident pager  Time of first page:  16:16  MD notified (2nd page): IMTS resident pager  Time of second page: 1630  Responding MD: IMTS resident  Time MD responded:  1830, no new orders at this time. No s/s of acute distress noted.

## 2014-11-08 NOTE — Progress Notes (Signed)
Speech Language Pathology Treatment: Dysphagia  Patient Details Name: Molly Jimenez MRN: [id number] DOB: 01-07-1926 Today's Date: 11/08/2014 Time: [id number] SLP Time Calculation (min) (ACUTE ONLY): 13 min  Assessment / Plan / Recommendation Clinical Impression  Making progress with functional goals. Differential diagnostics complete with diagnostic po trials to assess readiness for diet advancement. Patient without overt s/s of aspiration with po trial (dysphagia 3, thin liquid) with SLP providing maximum visual and tactile cueing for increased awareness of bolus and oral transit time, fading to min cueing by end of session. Overall appears safe to advance diet today. Discussed with RN. SLP will continue to f/u to monitor for safety and education.    HPI HPI: Molly Jimenez is an 81 y.o. female with a history of renal disorder, atrial fibrillation not on anticoagulation, GERD, hypercholesterolemia and dementia, brought to the emergency room for evaluation of altered mental status. MRI negative for acute infarct.   Pertinent Vitals Pain Assessment: Faces Faces Pain Scale: No hurt  SLP Plan  Continue with current plan of care    Recommendations Diet recommendations: Dysphagia 3 (mechanical soft);Thin liquid Liquids provided via: Cup;Straw Medication Administration: Crushed with puree Supervision: Full supervision/cueing for compensatory strategies;Staff to assist with self feeding Compensations: Slow rate;Small sips/bites Postural Changes and/or Swallow Maneuvers: Seated upright 90 degrees;Upright 30-60 min after meal              Oral Care Recommendations: Oral care BID Follow up Recommendations: None Plan: Continue with current plan of care    GO   Molly Jimenez, Molly Jimenez [phone]   Molly Jimenez 11/08/2014, 10:13 AM

## 2014-11-09 DIAGNOSIS — G309 Alzheimer's disease, unspecified: Secondary | ICD-10-CM

## 2014-11-09 DIAGNOSIS — F0280 Dementia in other diseases classified elsewhere without behavioral disturbance: Secondary | ICD-10-CM

## 2014-11-09 DIAGNOSIS — N179 Acute kidney failure, unspecified: Secondary | ICD-10-CM

## 2014-11-09 DIAGNOSIS — I519 Heart disease, unspecified: Secondary | ICD-10-CM

## 2014-11-09 DIAGNOSIS — R001 Bradycardia, unspecified: Secondary | ICD-10-CM | POA: Diagnosis present

## 2014-11-09 LAB — BASIC METABOLIC PANEL
Anion gap: 5 (ref 5–15)
Anion gap: 6 (ref 5–15)
Anion gap: 6 (ref 5–15)
BUN: 27 mg/dL — ABNORMAL HIGH (ref 6–23)
BUN: 24 mg/dL — AB (ref 6–23)
BUN: 24 mg/dL — ABNORMAL HIGH (ref 6–23)
CALCIUM: 7.7 mg/dL — AB (ref 8.4–10.5)
CALCIUM: 7.8 mg/dL — AB (ref 8.4–10.5)
CALCIUM: 7.7 mg/dL — AB (ref 8.4–10.5)
CHLORIDE: 125 mmol/L — AB (ref 96–112)
CO2: 26 mmol/L (ref 19–32)
CO2: 25 mmol/L (ref 19–32)
CO2: 25 mmol/L (ref 19–32)
CREATININE: 1.15 mg/dL — AB (ref 0.50–1.10)
CREATININE: 1.19 mg/dL — AB (ref 0.50–1.10)
Chloride: 126 mmol/L — ABNORMAL HIGH (ref 96–112)
Chloride: 120 mmol/L — ABNORMAL HIGH (ref 96–112)
Creatinine, Ser: 1.22 mg/dL — ABNORMAL HIGH (ref 0.50–1.10)
GFR calc Af Amer: 45 mL/min — ABNORMAL LOW (ref >90)
GFR calc Af Amer: 47 mL/min — ABNORMAL LOW (ref >90)
GFR calc non Af Amer: 42 mL/min — ABNORMAL LOW (ref >90)
GFR, EST AFRICAN AMERICAN: 48 mL/min — AB (ref >90)
GFR, EST NON AFRICAN AMERICAN: 39 mL/min — AB (ref >90)
GFR, EST NON AFRICAN AMERICAN: 40 mL/min — AB (ref >90)
GLUCOSE: 99 mg/dL (ref 70–99)
GLUCOSE: 114 mg/dL — AB (ref 70–99)
Glucose, Bld: 110 mg/dL — ABNORMAL HIGH (ref 70–99)
POTASSIUM: 3.7 mmol/L (ref 3.5–5.1)
Potassium: 3.5 mmol/L (ref 3.5–5.1)
Potassium: 3.8 mmol/L (ref 3.5–5.1)
SODIUM: 157 mmol/L — AB (ref 135–145)
SODIUM: 151 mmol/L — AB (ref 135–145)
Sodium: 156 mmol/L — ABNORMAL HIGH (ref 135–145)

## 2014-11-09 LAB — GLUCOSE, CAPILLARY
GLUCOSE-CAPILLARY: 128 mg/dL — AB (ref 70–99)
GLUCOSE-CAPILLARY: 92 mg/dL (ref 70–99)
Glucose-Capillary: 81 mg/dL (ref 70–99)
Glucose-Capillary: 106 mg/dL — ABNORMAL HIGH (ref 70–99)
Glucose-Capillary: 164 mg/dL — ABNORMAL HIGH (ref 70–99)

## 2014-11-09 LAB — CBC
HCT: 37.7 % (ref 36.0–46.0)
Hemoglobin: 11.4 g/dL — ABNORMAL LOW (ref 12.0–15.0)
MCH: 31.3 pg (ref 26.0–34.0)
MCHC: 30.2 g/dL (ref 30.0–36.0)
MCV: 103.6 fL — ABNORMAL HIGH (ref 78.0–100.0)
PLATELETS: 153 10*3/uL (ref 150–400)
RBC: 3.64 MIL/uL — ABNORMAL LOW (ref 3.87–5.11)
RDW: 13.4 % (ref 11.5–15.5)
WBC: 11.1 10*3/uL — ABNORMAL HIGH (ref 4.0–10.5)

## 2014-11-09 MED ORDER — CEPHALEXIN 250 MG/5ML PO SUSR
250.0000 mg | Freq: Two times a day (BID) | ORAL | Status: DC
  Administered 2014-11-09 (×2): 250 mg via ORAL
  Filled 2014-11-09 (×6): qty 5

## 2014-11-09 MED ORDER — CEPHALEXIN 125 MG/5ML PO SUSR
250.0000 mg | Freq: Two times a day (BID) | ORAL | Status: DC
  Filled 2014-11-09 (×2): qty 10

## 2014-11-09 MED ORDER — POTASSIUM CL IN DEXTROSE 5% 20 MEQ/L IV SOLN
20.0000 meq | INTRAVENOUS | Status: DC
  Administered 2014-11-09 – 2014-11-10 (×5): 20 meq via INTRAVENOUS
  Filled 2014-11-09 (×9): qty 1000

## 2014-11-09 NOTE — Progress Notes (Signed)
Subjective:  Continues to be disoriented but more interactive today. Afebrile overnight. Sodium correcting slowly 157 this am. Remains bradycardic in 40-50's.  Objective: Vital signs in last 24 hours: Filed Vitals:   11/09/14 0400 11/09/14 0451 11/09/14 0500 11/09/14 0725  BP: 101/50     Pulse: 44     Temp:  98 F (36.7 C)  97.8 F (36.6 C)  TempSrc:  Axillary  Axillary  Resp: 16     Height:      Weight:   133 lb 9.6 oz (60.6 kg)   SpO2: 99%      Weight change: 6 lb 6.3 oz (2.9 kg)  Intake/Output Summary (Last 24 hours) at 11/09/14 0954 Last data filed at 11/09/14 0810  Gross per 24 hour  Intake 1168.33 ml  Output   1305 ml  Net -136.67 ml   Vitals reviewed. General: resting in bed, confused, follows some commands.  HEENT: PERRL, EOMI, no scleral icterus. Oral mucosa more moist  Cardiac: RRR, no rubs, murmurs or gallops Pulm: clear to auscultation bilaterally, no wheezes, rales, or rhonchi Abd: soft, has some tenderness on RUQ Ext: warm and well perfused, no pedal edema Neuro: alert and oriented to self only, mild left sided facial droop. Moves all ext equally.   Lab Results: Basic Metabolic Panel:  Recent Labs Lab 11/06/14 1332  11/07/14 0138  11/08/14 1709 11/09/14 0329  NA  --   < > 174*  < > 159* 157*  K  --   < > 3.4*  < > 3.4* 3.7  CL  --   < > >130*  < > 128* 126*  CO2  --   < > 25  < > 24 26  GLUCOSE  --   < > 105*  < > 130* 99  BUN  --   < > 53*  < > 35* 27*  CREATININE  --   < > 1.60*  < > 1.31* 1.22*  CALCIUM  --   < > 8.8  < > 7.9* 7.7*  MG 3.0*  --  2.5  --   --   --   PHOS 2.8  --   --   --   --   --   < > = values in this interval not displayed. Liver Function Tests:  Recent Labs Lab 11/06/14 1102 11/07/14 0138  AST 40* 36  ALT 31 33  ALKPHOS 70 59  BILITOT 1.1 0.9  PROT 7.8 5.9*  ALBUMIN 3.3* 2.6*   No results for input(s): LIPASE, AMYLASE in the last 168 hours. No results for input(s): AMMONIA in the last 168  hours. CBC:  Recent Labs Lab 11/06/14 1102  11/08/14 0334 11/09/14 0329  WBC 15.6*  < > 13.9* 11.1*  NEUTROABS 12.5*  --   --   --   HGB 15.3*  < > 12.3 11.4*  HCT 49.8*  < > 41.6 37.7  MCV 105.7*  < > 106.9* 103.6*  PLT 224  < > 171 153  < > = values in this interval not displayed. Cardiac Enzymes:  Recent Labs Lab 11/06/14 1332 11/06/14 1840 11/07/14 0138  CKTOTAL  --  55  --   TROPONINI 0.06* 0.07* 0.06*   CBG:  Recent Labs Lab 11/08/14 1206 11/08/14 1627 11/08/14 1953 11/08/14 2352 11/09/14 0453 11/09/14 0806  GLUCAP 135* 110* 113* 128* 92 81   Hemoglobin A1C:  Recent Labs Lab 11/06/14 1333  HGBA1C 5.7*   Fasting Lipid Panel:  Recent Labs Lab 11/06/14 1335  CHOL 147  HDL 30*  LDLCALC 96  TRIG 161104  CHOLHDL 4.9   Thyroid Function Tests:  Recent Labs Lab 11/06/14 1333 11/06/14 1840  TSH 0.168*  --   FREET4  --  1.00   Coagulation:  Recent Labs Lab 11/06/14 1102  LABPROT 15.1  INR 1.18   Anemia Panel:  Recent Labs Lab 11/06/14 1840  VITAMINB12 1202*  FOLATE >20.0   Urine Drug Screen: Drugs of Abuse     Component Value Date/Time   LABOPIA POSITIVE* 11/06/2014 1054   COCAINSCRNUR NONE DETECTED 11/06/2014 1054   LABBENZ NONE DETECTED 11/06/2014 1054   AMPHETMU NONE DETECTED 11/06/2014 1054   THCU NONE DETECTED 11/06/2014 1054   LABBARB NONE DETECTED 11/06/2014 1054    Alcohol Level:  Recent Labs Lab 11/06/14 1102  ETH <5   Urinalysis:  Recent Labs Lab 11/06/14 1054  COLORURINE YELLOW  LABSPEC 1.013  PHURINE 5.5  GLUCOSEU NEGATIVE  HGBUR SMALL*  BILIRUBINUR NEGATIVE  KETONESUR NEGATIVE  PROTEINUR 30*  UROBILINOGEN 0.2  NITRITE POSITIVE*  LEUKOCYTESUR LARGE*   Micro Results: Recent Results (from the past 240 hour(s))  Urine culture     Status: None   Collection Time: 11/06/14 10:54 AM  Result Value Ref Range Status   Specimen Description URINE, RANDOM  Final   Special Requests NONE  Final   Colony  Count   Final    >=100,000 COLONIES/ML Performed at [facility]    Culture   Final    ESCHERICHIA COLI Performed at [facility]    Report Status 11/08/2014 FINAL  Final   Organism ID, Bacteria ESCHERICHIA COLI  Final      Susceptibility   Escherichia coli - MIC*    AMPICILLIN <=2 SENSITIVE Sensitive     CEFAZOLIN <=4 SENSITIVE Sensitive     CEFTRIAXONE <=1 SENSITIVE Sensitive     CIPROFLOXACIN <=0.25 SENSITIVE Sensitive     GENTAMICIN <=1 SENSITIVE Sensitive     LEVOFLOXACIN <=0.12 SENSITIVE Sensitive     NITROFURANTOIN <=16 SENSITIVE Sensitive     TOBRAMYCIN <=1 SENSITIVE Sensitive     TRIMETH/SULFA <=20 SENSITIVE Sensitive     PIP/TAZO <=4 SENSITIVE Sensitive     * ESCHERICHIA COLI  MRSA PCR Screening     Status: None   Collection Time: 11/06/14  6:00 PM  Result Value Ref Range Status   MRSA by PCR NEGATIVE NEGATIVE Final    Comment:        The GeneXpert MRSA Assay (FDA approved for NASAL specimens only), is one component of a comprehensive MRSA colonization surveillance program. It is not intended to diagnose MRSA infection nor to guide or monitor treatment for MRSA infections.   Culture, blood (routine x 2)     Status: None (Preliminary result)   Collection Time: 11/06/14  6:30 PM  Result Value Ref Range Status   Specimen Description BLOOD LEFT ANTECUBITAL  Final   Special Requests BOTTLES DRAWN AEROBIC AND ANAEROBIC 5CC  Final   Culture   Final           BLOOD CULTURE RECEIVED NO GROWTH TO DATE CULTURE WILL BE HELD FOR 5 DAYS BEFORE ISSUING A FINAL NEGATIVE REPORT Performed at [facility]    Report Status PENDING  Incomplete  Culture, blood (routine x 2)     Status: None (Preliminary result)   Collection Time: 11/06/14  6:40 PM  Result Value Ref Range Status   Specimen Description BLOOD LEFT  HAND  Final   Special Requests BOTTLES DRAWN AEROBIC AND ANAEROBIC 5CC  Final   Culture   Final           BLOOD CULTURE RECEIVED NO GROWTH TO  DATE CULTURE WILL BE HELD FOR 5 DAYS BEFORE ISSUING A FINAL NEGATIVE REPORT Performed at [facility]    Report Status PENDING  Incomplete   Studies/Results: US Renal  11/07/2014   CLINICAL DATA:  Rising creatinine.  Renal disorder.  EXAM: RENAL / URINARY TRACT ULTRASOUND COMPLETE  COMPARISON:  None.  FINDINGS: Right Kidney:  Length: 9.7 cm. Renal cortical thinning with increased renal cortical echogenicity. No mass or hydronephrosis visualized.  Left Kidney:  Length: 9.7 cm. Renal cortical thinning with increased renal cortical echogenicity. No mass or hydronephrosis visualized.  Bladder:  Decompressed bladder with a Foley catheter present.  IMPRESSION: 1. No obstructive uropathy. 2. Bilateral renal cortical thinning and increased renal cortical echogenicity as can be seen with medical renal disease.   Electronically Signed   By: Elige Ko   On: 11/07/2014 19:41   Medications: I have reviewed the patient's current medications. Scheduled Meds: . antiseptic oral rinse  7 mL Mouth Rinse q12n4p  . cefTRIAXone (ROCEPHIN) IVPB 1 gram/50 mL D5W  1 g Intravenous Q24H  . chlorhexidine  15 mL Mouth Rinse BID  . feeding supplement (RESOURCE BREEZE)  1 Container Oral TID BM  . heparin subcutaneous  5,000 Units Subcutaneous 3 times per day  . sodium chloride  10-40 mL Intracatheter Q12H  . sodium chloride  3 mL Intravenous Q12H   Continuous Infusions: . dextrose 5 % with KCl 20 mEq / L 20 mEq (11/09/14 0730)   PRN Meds:.ondansetron **OR** ondansetron (ZOFRAN) IV, sodium chloride Assessment/Plan: Principal Problem:   Acute encephalopathy Active Problems:   Hypernatremia   Acute renal failure superimposed on stage 3 chronic kidney disease   UTI (urinary tract infection)   Atrial fibrillation with rapid ventricular response   Hypercalcemia   Osteopenia   Prediabetes   GERD (gastroesophageal reflux disease)   Allergic rhinitis   Diastolic dysfunction   Subclinical hyperthyroidism    Polyclonal gammopathy determined by serum protein electrophoresis   Metabolic encephalopathy  81 yo female with hx of dementia here with worsening AMS and severe hypernatremia  Severe hypernatremia - Initial sodium 174-176l - Likely 2/2 to restricted PO intake of water as she is demented and also was in a nursing home. Was very dry on exam. Could also have partial diabetic insipidus as well.  Uosm 460 (lower than expected for volume depletion but could have decreased ability to maximally concentrate urine due to age. It is also higher than expected for DI. UNa 47 (higher than expected for volume depletion). May have an overlap of restricted water intake and partial DI. Having good UOP 1.5 L yesterday.  Una repeated 101, Uosm 410.-total free water deficit on admission 8 liters.  - goal correction is 0.5 meq per hour. We are at goal today 157. We have been adjusting her fluids to meet the goal. Currently on D5W+KCL 50cc/hr. Will like discharge back to ALF once sodium reaches in 140's. -  appreciate nephrology recs - continue q8hr BMET -strict i/o, cont foley.  AMS - likely 2/2 to severe hypernatremia  Could also be from seizure 2/2 to hypernatremia. - does have left facial droop but unsure if this is acute or chronic. - Ct head negative, MRI negative. Likely not stroke. -appreciate neuro rec.  - fix hypernatremia  as above.  Intermittent bradycardia as low as 20 BPM's with first degree block and PVC - consulted cardiology patient is DNR and with her poor prognosis she may not be a good candidate for pace maker. - f/up card recs: no intervention for now as she is very demented.  Questionable Afib with RVR on presentation - resolved. EKG unclear if it was truly Afib. - was on dilt drip and heparin gtt . Both d/ced. Initially tachycardia was likely 2/2 to dehdyration.  - echo normal EF 60-65%, grade 1 diastolic dysfunction.   UTI - from [employer]. - was on ceftriaxone, will switch  to Keflex  q12h based on her CrCL <30 and treat total 7 days.;  Hypercalcemia - corrected was 11. Improved. - could be from immobilization, hypernatremia?  Sick euthyroid syndrome - low tsh 0.168, normal T4 1.0  Macrocytosis - hemoglobin normal 12.9. Unclear what baseline is. - was 15.3 hgb initiailly, improved with IVF. - MCV 108. Folate and b12 normal. TSH low but T4 normal. Unclear of the cause of macrocytosis.  DNR - confirmed with her state appointed guardian Desire Rogelio Seen [phone] or [phone].  Heparin dvt ppx Dysphagia 3 diet.    Dispo: Disposition is deferred at this time, awaiting improvement of current medical problems.  Anticipated discharge in approximately 1-2 day(s).   The patient does have a current PCP (No primary care provider on file.) and does need an [facility] hospital follow-up appointment after discharge.  The patient does have transportation limitations that hinder transportation to clinic appointments.  .Services Needed at time of discharge: Y = Yes, Blank = No PT:   OT:   RN:   Equipment:   Other:     LOS: 3 days   Hyacinth Meeker, MD 11/09/2014, 9:54 AM

## 2014-11-09 NOTE — Progress Notes (Signed)
Report called to 6 east at 1740 and was started. Tornado warning was called and report had to be put on hold. Finished report at [id number] with nurse Abigail ButtsKamika.

## 2014-11-09 NOTE — Progress Notes (Signed)
Speech Language Pathology Treatment: Dysphagia  Patient Details Name: Molly Jimenez MRN: [id number] DOB: 1925-09-27 Today's Date: 11/09/2014 Time: 1517-6160 SLP Time Calculation (min) (ACUTE ONLY): 16 min  Assessment / Plan / Recommendation Clinical Impression  Skilled observation complete with am meal. SLP provided set up assist and through use of min verbal, visual, and contextual cues, patient abe to self feed am meal to increase safety of swallow. No overt indication of aspiration observed with dysphagia 3 texture solids and thin liquids. Dysphagia 3 solids appropriate given missing dentition. No additional SLP services indicated at this time as patient appears to be tolerating diet. Education complete with RN regarding encouraging patient to self feed as able to facilitate safe swallow and maximize independence.   HPI HPI: Molly Jimenez is an 81 y.o. female with a history of renal disorder, atrial fibrillation not on anticoagulation, GERD, hypercholesterolemia and dementia, brought to the emergency room for evaluation of altered mental status. MRI negative for acute infarct.   Pertinent Vitals Pain Assessment: Faces Faces Pain Scale: No hurt  SLP Plan  All goals met    Recommendations Diet recommendations: Dysphagia 3 (mechanical soft);Thin liquid Liquids provided via: Cup;Straw Medication Administration: Crushed with puree Supervision: Full supervision/cueing for compensatory strategies;Staff to assist with self feeding;Patient able to self feed Compensations: Slow rate;Small sips/bites Postural Changes and/or Swallow Maneuvers: Seated upright 90 degrees;Upright 30-60 min after meal              Oral Care Recommendations: Oral care BID Follow up Recommendations: None Plan: All goals met    Ransom Le Grand, CCC-SLP [phone]   Molly Jimenez 11/09/2014, 9:30 AM

## 2014-11-09 NOTE — Progress Notes (Signed)
Admit: 11/06/2014 LOS: 3  Molly Jimenez with hypernatremia, dementia in SNF, UTI  Subjective:  U Cx with >100k pansensitive E. coli Serum Na improving to 157 Not polyuria   04/27 0701 - 04/28 0700 In: 1060 [P.O.:360; I.V.:600; IV Piggyback:100] Out: 1580 [Urine:1580]  Filed Weights   11/07/14 0500 11/08/14 0500 11/09/14 0500  Weight: 55.9 kg (123 lb 3.8 oz) 57.7 kg (127 lb 3.3 oz) 60.6 kg (133 lb 9.6 oz)    Scheduled Meds: . antiseptic oral rinse  7 mL Mouth Rinse q12n4p  . cephALEXin  250 mg Oral Q12H  . chlorhexidine  15 mL Mouth Rinse BID  . feeding supplement (RESOURCE BREEZE)  1 Container Oral TID BM  . heparin subcutaneous  5,000 Units Subcutaneous 3 times per day  . sodium chloride  10-40 mL Intracatheter Q12H  . sodium chloride  3 mL Intravenous Q12H   Continuous Infusions: . dextrose 5 % with KCl 20 mEq / L 20 mEq (11/09/14 0730)   PRN Meds:.ondansetron **OR** ondansetron (ZOFRAN) IV, sodium chloride  Current Labs: reviewed    Physical Exam:  Blood pressure 101/50, pulse 44, temperature 97.8 F (36.6 C), temperature source Axillary, resp. rate 16, height  (1.575 m), weight 60.6 kg (133 lb 9.6 oz), SpO2 99 %. GEN: awake, not interactive ENT: Dry MM EYES: EOMI, sunken CV: IRIR, soft murmur, systolic PULM: CTAB ABD: s/nt/nd SKIN: no rashes/lesions EXT:no edema  A/P 1. Hypernatremia 1. Likely impaired free water access and inability to maximally concentrate urine 2/2 age, CKD (suspect present based on Renal US findings), and insufficient solute intake to generate urine and create maximal medullary concentration gradient; doubt partial DI in absence of polyuria 2.  Keep IVF D5W at 155mL/hr 3. Cont q4h Serum Na levels 4. Goal correction of serum Na is no greater than 12 in 24h 5. Agree with Foley Catheter 6. Provide liberal access to water -- apparently she will drink if offered but will not obtain on her own -- will need to work towards this to keep her stable off  IVFs moving forward 2. AKI vs CKD: no baseline data: 1. follow with time 2. Renal US as above 3. SPEP w/o M spike 4. Trending down 3. Dementia at baseline, in SNF 1. E. Coli UTI, per primary 4. AFib with some RVR upon presentation: cardiology following  Sabra Heck MD 11/09/2014, 11:04 AM   Recent Labs Lab 11/06/14 1332  11/08/14 1506 11/08/14 1709 11/09/14 0329  NA  --   < > 163* 159* 157*  K  --   < > 3.6 3.4* 3.7  CL  --   < > >130* 128* 126*  CO2  --   < > GLUCOSE  --   < > 131* 130* 99  BUN  --   < > 37* 35* 27*  CREATININE  --   < > 1.41* 1.31* 1.22*  CALCIUM  --   < > 8.1* 7.9* 7.7*  PHOS 2.8  --   --   --   --   < > = values in this interval not displayed.  Recent Labs Lab 11/06/14 1102  11/07/14 0138 11/08/14 0334 11/09/14 0329  WBC 15.6*  --  15.5* 13.9* 11.1*  NEUTROABS 12.5*  --   --   --   --   HGB 15.3*  < > 12.9 12.3 11.4*  HCT 49.8*  < > 43.9 41.6 37.7  MCV 105.7*  --  108.1* 106.9* 103.6*  PLT 224  --  186 171 153  < > = values in this interval not displayed.

## 2014-11-09 NOTE — Progress Notes (Signed)
Patient ID: Molly Jimenez, female   DOB: 22-Jul-1925, 81 y.o.   MRN: [id number] Medicine attending: Cardiology input appreciated. She is much brighter today. More interactive. Exam unchanged. Sodium coming down slowly and nicely now 157. Potassium 3.7 after replacement. BUN and creatinine down to 27 and 1.2 compared with admission values of 59 and 1.9. She remains bradycardic. Blood pressure stable. Echocardiogram with normal ventricular function, grade 1 diastolic dysfunction, no major valve abnormalities. MRI of the brain suboptimal due to motion but no gross intracranial mass lesion and major vessels patent. Question mild hydrocephalus versus ventricular prominence due to age-related atrophy. #1. 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 Responding to fluid replacement. #2. Initial tachycardia arrhythmia now bradycardia arrhythmia in an elderly woman. No immediate plans to intervene at this time. #3. Urinary tract infection with Escherichia coli currently being treated we will change to by mouth antibiotics today. #4. Alzheimer's type dementia

## 2014-11-09 NOTE — Progress Notes (Signed)
    Subjective:  Sleepy, no complaints  Objective:  Vital Signs in the last 24 hours: Temp:  [97.2 F (36.2 C)-98.3 F (36.8 C)] 97.8 F (36.6 C) (04/28 0725) Pulse Rate:  [44-52] 44 (04/28 0400) Resp:  [16-23] 16 (04/28 0400) BP: (97-120)/(41-52) 101/50 mmHg (04/28 0400) SpO2:  [95 %-99 %] 99 % (04/28 0400) Weight:  [133 lb 9.6 oz (60.6 kg)] 133 lb 9.6 oz (60.6 kg) (04/28 0500)  Intake/Output from previous day: 04/27 0701 - 04/28 0700 In: 1060 [P.O.:360; I.V.:600; IV Piggyback:100] Out: 1580 [Urine:1580]   Physical Exam: General: Elderly, in no acute distress. Head:  Normocephalic and atraumatic. Lungs: Clear to auscultation and percussion. Heart:Brady rr S1 and S2.  No murmur, rubs or gallops.  Abdomen: soft, non-tender, positive bowel sounds. Extremities: No clubbing or cyanosis. No edema. Neurologic: sleeping    Lab Results:  Recent Labs  11/08/14 0334 11/09/14 0329  WBC 13.9* 11.1*  HGB 12.3 11.4*  PLT 171 153    Recent Labs  11/08/14 1709 11/09/14 0329  NA 159* 157*  K 3.4* 3.7  CL 128* 126*  CO2 24 26  GLUCOSE 130* 99  BUN 35* 27*  CREATININE 1.31* 1.22*    Recent Labs  11/06/14 1840 11/07/14 0138  TROPONINI 0.07* 0.06*   Hepatic Function Panel  Recent Labs  11/07/14 0138  PROT 5.9*  ALBUMIN 2.6*  AST 36  ALT 33  ALKPHOS 59  BILITOT 0.9    Recent Labs  11/06/14 1335  CHOL 147   No results for input(s): PROTIME in the last 72 hours.  Imaging: [place] Renal  11/07/2014   CLINICAL DATA:  Rising creatinine.  Renal disorder.  EXAM: RENAL / URINARY TRACT ULTRASOUND COMPLETE  COMPARISON:  None.  FINDINGS: Right Kidney:  Length: 9.7 cm. Renal cortical thinning with increased renal cortical echogenicity. No mass or hydronephrosis visualized.  Left Kidney:  Length: 9.7 cm. Renal cortical thinning with increased renal cortical echogenicity. No mass or hydronephrosis visualized.  Bladder:  Decompressed bladder with a Foley catheter present.   IMPRESSION: 1. No obstructive uropathy. 2. Bilateral renal cortical thinning and increased renal cortical echogenicity as can be seen with medical renal disease.   Electronically Signed   By: Elige KoHetal  Patel   On: 11/07/2014 19:41   Personally viewed.   Telemetry: HR 55 now (mostly 50's prior, no significant pauses) Personally viewed.  Cardiac Studies:  EF normal on ECHO  Assessment/Plan:  Principal Problem:   Acute encephalopathy Active Problems:   Hypernatremia   Acute renal failure superimposed on stage 3 chronic kidney disease   UTI (urinary tract infection)   Atrial fibrillation with rapid ventricular response   Hypercalcemia   Osteopenia   Prediabetes   GERD (gastroesophageal reflux disease)   Allergic rhinitis   Diastolic dysfunction   Subclinical hyperthyroidism   Polyclonal gammopathy determined by serum protein electrophoresis   Metabolic encephalopathy   Bradycardia  - agree with no intervention at present time  - aviod AV nodal blocking agent  - perhaps as metabolic derangement improves, heart rate will continue to improve as well.   - TSH 0.168 slightly low (not hypothyroid)  Parox AFIB  - no further episodes  - spontaneous conversion on IV Cardizem  - not an optimal anticoagulation candidate, agree  No signs currently of diastolic heart failure  Will sign off. Please call if ?    Devri Kreher 11/09/2014, 10:44 AM

## 2014-11-10 ENCOUNTER — Inpatient Hospital Stay (HOSPITAL_COMMUNITY): Payer: Medicare Other

## 2014-11-10 DIAGNOSIS — R4182 Altered mental status, unspecified: Secondary | ICD-10-CM

## 2014-11-10 DIAGNOSIS — R29810 Facial weakness: Secondary | ICD-10-CM

## 2014-11-10 DIAGNOSIS — E213 Hyperparathyroidism, unspecified: Secondary | ICD-10-CM

## 2014-11-10 DIAGNOSIS — E0781 Sick-euthyroid syndrome: Secondary | ICD-10-CM

## 2014-11-10 DIAGNOSIS — B9620 Unspecified Escherichia coli [E. coli] as the cause of diseases classified elsewhere: Secondary | ICD-10-CM

## 2014-11-10 DIAGNOSIS — N2581 Secondary hyperparathyroidism of renal origin: Secondary | ICD-10-CM | POA: Diagnosis present

## 2014-11-10 DIAGNOSIS — D7589 Other specified diseases of blood and blood-forming organs: Secondary | ICD-10-CM

## 2014-11-10 LAB — BASIC METABOLIC PANEL
ANION GAP: 7 (ref 5–15)
Anion gap: 9 (ref 5–15)
BUN: 24 mg/dL — AB (ref 6–23)
BUN: 16 mg/dL (ref 6–23)
CHLORIDE: 117 mmol/L — AB (ref 96–112)
CO2: 22 mmol/L (ref 19–32)
CO2: 21 mmol/L (ref 19–32)
Calcium: 7.6 mg/dL — ABNORMAL LOW (ref 8.4–10.5)
Calcium: 8 mg/dL — ABNORMAL LOW (ref 8.4–10.5)
Chloride: 122 mmol/L — ABNORMAL HIGH (ref 96–112)
Creatinine, Ser: 1.11 mg/dL — ABNORMAL HIGH (ref 0.50–1.10)
Creatinine, Ser: 1.06 mg/dL (ref 0.50–1.10)
GFR calc non Af Amer: 46 mL/min — ABNORMAL LOW (ref >90)
GFR, EST AFRICAN AMERICAN: 51 mL/min — AB (ref >90)
GFR, EST AFRICAN AMERICAN: 54 mL/min — AB (ref >90)
GFR, EST NON AFRICAN AMERICAN: 44 mL/min — AB (ref >90)
GLUCOSE: 102 mg/dL — AB (ref 70–99)
Glucose, Bld: 105 mg/dL — ABNORMAL HIGH (ref 70–99)
Potassium: 4.1 mmol/L (ref 3.5–5.1)
Potassium: 4.4 mmol/L (ref 3.5–5.1)
SODIUM: 147 mmol/L — AB (ref 135–145)
Sodium: 151 mmol/L — ABNORMAL HIGH (ref 135–145)

## 2014-11-10 LAB — CBC
HCT: 38.8 % (ref 36.0–46.0)
Hemoglobin: 12.6 g/dL (ref 12.0–15.0)
MCH: 32.9 pg (ref 26.0–34.0)
MCHC: 32.5 g/dL (ref 30.0–36.0)
MCV: 101.3 fL — ABNORMAL HIGH (ref 78.0–100.0)
Platelets: 133 10*3/uL — ABNORMAL LOW (ref 150–400)
RBC: 3.83 MIL/uL — AB (ref 3.87–5.11)
RDW: 13.1 % (ref 11.5–15.5)
WBC: 10.5 10*3/uL (ref 4.0–10.5)

## 2014-11-10 LAB — PARATHYROID HORMONE, INTACT (NO CA): PTH: 124 pg/mL — AB (ref 15–65)

## 2014-11-10 NOTE — Clinical Social Work Note (Signed)
CSW has faxed over clinical information to [facility] [phone]) for review. CSW will fax PT and OT evaluations once received.  Roddie McBryant Kjerstin Abrigo MSW, [place], [place], [phone]

## 2014-11-10 NOTE — Progress Notes (Signed)
UR COMPLETED  

## 2014-11-10 NOTE — Progress Notes (Addendum)
Admit: 11/06/2014 LOS: 4  26F with hypernatremia, dementia in SNF, UTI  Subjective:  Sodium improved to 151 On ABX for E. Coli UTI Remains on D5W with KCl   04/28 0701 - 04/29 0700 In: 2241.3 [P.O.:120; I.V.:2121.3] Out: 2030 [Urine:2030]  Filed Weights   11/08/14 0500 11/09/14 0500 11/09/14 2023  Weight: 57.7 kg (127 lb 3.3 oz) 60.6 kg (133 lb 9.6 oz) 59.5 kg (131 lb 2.8 oz)    Scheduled Meds: . cephALEXin  250 mg Oral Q12H  . chlorhexidine  15 mL Mouth Rinse BID  . feeding supplement (RESOURCE BREEZE)  1 Container Oral TID BM  . heparin subcutaneous  5,000 Units Subcutaneous 3 times per day  . sodium chloride  10-40 mL Intracatheter Q12H  . sodium chloride  3 mL Intravenous Q12H   Continuous Infusions: . dextrose 5 % with KCl 20 mEq / L 20 mEq (11/10/14 0520)   PRN Meds:.ondansetron **OR** ondansetron (ZOFRAN) IV, sodium chloride  Current Labs: reviewed    Physical Exam:  Blood pressure 128/50, pulse 50, temperature 98.2 F (36.8 C), temperature source Oral, resp. rate 16, height 5\' 2"  (1.575 m), weight 59.5 kg (131 lb 2.8 oz), SpO2 100 %. GEN: awake, not interactive ENT: Dry MM EYES: EOMI, sunken CV: IRIR, soft murmur, systolic PULM: CTAB ABD: s/nt/nd SKIN: no rashes/lesions EXT:no edema  A/P 1. Hypernatremia 1. Likely impaired free water access and inability to maximally concentrate urine 2/2 age, CKD (suspect present based on Renal US findings), and insufficient solute intake to generate urea and create maximal medullary concentration gradient; doubt partial DI in absence of polyuria 2. Cont D5W to target Na < 145 3. Goal correction of serum Na is no greater than 12 in 24h 4. Agree with Foley Catheter 5. Provide liberal access to water -- apparently she will drink if offered but will not obtain on her own -- will need to work towards this to keep her stable off IVFs moving forward.  She also will need to be provided sufficient protein in order to maximally  concentrate her urine 6. We'll sign off for now. Please call if any questions or concerns. Critical for her success moving forward will be her ability to take in free water, it looks like she will need regular prompting from CNAs and nursing 2. AKI vs CKD: no baseline data: Appears to likely have mild CKD 3. 1. follow with time 2. Renal US as above 3. SPEP w/o M spike 4. Trending down 3. Dementia at baseline, in SNF 4. E. Coli UTI, per primary 5. AFib with some RVR upon presentation and bradycardia: cardiology following  Sabra Heckyan Wai Minotti MD 11/10/2014, 12:10 PM   Recent Labs Lab 11/06/14 1332  11/09/14 1344 11/09/14 2310 11/10/14 0153  NA  --   < > 156* 151* 151*  K  --   < > 3.5 3.8 4.1  CL  --   < > 125* 120* 122*  CO2  --   < > 25 25 22   GLUCOSE  --   < > 110* 114* 105*  BUN  --   < > 24* 24* 24*  CREATININE  --   < > 1.15* 1.19* 1.11*  CALCIUM  --   < > 7.8* 7.7* 7.6*  PHOS 2.8  --   --   --   --   < > = values in this interval not displayed.  Recent Labs Lab 11/06/14 1102  11/08/14 0334 11/09/14 0329 11/10/14 0153  WBC 15.6*  < >  13.9* 11.1* 10.5  NEUTROABS 12.5*  --   --   --   --   HGB 15.3*  < > 12.3 11.4* 12.6  HCT 49.8*  < > 41.6 37.7 38.8  MCV 105.7*  < > 106.9* 103.6* 101.3*  PLT 224  < > 171 153 133*  < > = values in this interval not displayed.

## 2014-11-10 NOTE — Progress Notes (Addendum)
Subjective: Only says 1-2 words occasionally. Otherwise remains quite but calm. Afebrile overnight. Sodium correcting slowly 151. Remains bradycardic in 30-40's.  Objective: Vital signs in last 24 hours: Filed Vitals:   11/09/14 1500 11/09/14 1600 11/09/14 2023 11/10/14 0418  BP:  110/54 103/42 133/49  Pulse:  50 51 46  Temp: 98.3 F (36.8 C)  98.3 F (36.8 C) 98.8 F (37.1 C)  TempSrc: Axillary  Oral Oral  Resp:  Height:    (1.575 m)   Weight:   131 lb 2.8 oz (59.5 kg)   SpO2:  97% 97% 99%   Weight change: -2 lb 6.8 oz (-1.1 kg)  Intake/Output Summary (Last 24 hours) at 11/10/14 0737 Last data filed at 11/10/14 0645  Gross per 24 hour  Intake 2241.33 ml  Output   2030 ml  Net 211.33 ml   Vitals reviewed. General: resting in bed, confused, follows some commands.  HEENT: PERRL, EOMI, no scleral icterus. Oral mucosa more moist  Cardiac: RRR, no rubs, murmurs or gallops Pulm: clear to auscultation bilaterally, no wheezes, rales, or rhonchi Abd: soft, continues to have some tenderness on RUQ Ext: warm and well perfused, no pedal edema Neuro: alert and oriented to self only, mild left sided facial droop. Moves all ext equally.   Lab Results: Basic Metabolic Panel:  Recent Labs Lab 11/06/14 1332  11/07/14 0138  11/09/14 2310 11/10/14 0153  NA  --   < > 174*  < > 151* 151*  K  --   < > 3.4*  < > 3.8 4.1  CL  --   < > >130*  < > 120* 122*  CO2  --   < > 25  < > 25 22  GLUCOSE  --   < > 105*  < > 114* 105*  BUN  --   < > 53*  < > 24* 24*  CREATININE  --   < > 1.60*  < > 1.19* 1.11*  CALCIUM  --   < > 8.8  < > Molly.Molly* Molly.6*  MG 3.0*  --  2.5  --   --   --   PHOS 2.8  --   --   --   --   --   < > = values in this interval not displayed. Liver Function Tests:  Recent Labs Lab 11/06/14 1102 11/07/14 0138  AST 40* 36  ALT 31 33  ALKPHOS 70 59  BILITOT 1.1 0.9  PROT Molly.8 5.9*  ALBUMIN 3.3* 2.6*   No results for input(s): LIPASE, AMYLASE in the last  168 hours. No results for input(s): AMMONIA in the last 168 hours. CBC:  Recent Labs Lab 11/06/14 1102  11/09/14 0329 11/10/14 0153  WBC 15.6*  < > 11.1* 10.5  NEUTROABS 12.5*  --   --   --   HGB 15.3*  < > 11.4* 12.6  HCT 49.8*  < > 37.Molly 38.8  MCV 105.Molly*  < > 103.6* 101.3*  PLT 224  < > 153 133*  < > = values in this interval not displayed. Cardiac Enzymes:  Recent Labs Lab 11/06/14 1332 11/06/14 1840 11/07/14 0138  CKTOTAL  --  55  --   TROPONINI 0.06* 0.07* 0.06*   CBG:  Recent Labs Lab 11/08/14 1953 11/08/14 2352 11/09/14 0453 11/09/14 0806 11/09/14 1140 11/09/14 2004  GLUCAP 113* 128* 92 81 106* 164*   Hemoglobin A1C:  Recent Labs Lab 11/06/14 1333  HGBA1C  5.Molly*   Fasting Lipid Panel:  Recent Labs Lab 11/06/14 1335  CHOL 147  HDL 30*  LDLCALC 96  TRIG 161  CHOLHDL 4.9   Thyroid Function Tests:  Recent Labs Lab 11/06/14 1333 11/06/14 1840  TSH 0.168*  --   FREET4  --  1.00   Coagulation:  Recent Labs Lab 11/06/14 1102  LABPROT 15.1  INR 1.18   Anemia Panel:  Recent Labs Lab 11/06/14 1840  VITAMINB12 1202*  FOLATE >20.0   Urine Drug Screen: Drugs of Abuse     Component Value Date/Time   LABOPIA POSITIVE* 11/06/2014 1054   COCAINSCRNUR NONE DETECTED 11/06/2014 1054   LABBENZ NONE DETECTED 11/06/2014 1054   AMPHETMU NONE DETECTED 11/06/2014 1054   THCU NONE DETECTED 11/06/2014 1054   LABBARB NONE DETECTED 11/06/2014 1054    Alcohol Level:  Recent Labs Lab 11/06/14 1102  ETH <5   Urinalysis:  Recent Labs Lab 11/06/14 1054  COLORURINE YELLOW  LABSPEC 1.013  PHURINE 5.5  GLUCOSEU NEGATIVE  HGBUR SMALL*  BILIRUBINUR NEGATIVE  KETONESUR NEGATIVE  PROTEINUR 30*  UROBILINOGEN 0.2  NITRITE POSITIVE*  LEUKOCYTESUR LARGE*   Micro Results: Recent Results (from the past 240 hour(s))  Urine culture     Status: None   Collection Time: 11/06/14 10:54 AM  Result Value Ref Range Status   Specimen Description  URINE, RANDOM  Final   Special Requests NONE  Final   Colony Count   Final    >=100,000 COLONIES/ML Performed at [facility]    Culture   Final    ESCHERICHIA COLI Performed at [facility]    Report Status 11/08/2014 FINAL  Final   Organism ID, Bacteria ESCHERICHIA COLI  Final      Susceptibility   Escherichia coli - MIC*    AMPICILLIN <=2 SENSITIVE Sensitive     CEFAZOLIN <=4 SENSITIVE Sensitive     CEFTRIAXONE <=1 SENSITIVE Sensitive     CIPROFLOXACIN <=0.25 SENSITIVE Sensitive     GENTAMICIN <=1 SENSITIVE Sensitive     LEVOFLOXACIN <=0.12 SENSITIVE Sensitive     NITROFURANTOIN <=16 SENSITIVE Sensitive     TOBRAMYCIN <=1 SENSITIVE Sensitive     TRIMETH/SULFA <=20 SENSITIVE Sensitive     PIP/TAZO <=4 SENSITIVE Sensitive     * ESCHERICHIA COLI  MRSA PCR Screening     Status: None   Collection Time: 11/06/14  6:00 PM  Result Value Ref Range Status   MRSA by PCR NEGATIVE NEGATIVE Final    Comment:        The GeneXpert MRSA Assay (FDA approved for NASAL specimens only), is one component of a comprehensive MRSA colonization surveillance program. It is not intended to diagnose MRSA infection nor to guide or monitor treatment for MRSA infections.   Culture, blood (routine x 2)     Status: None (Preliminary result)   Collection Time: 11/06/14  6:30 PM  Result Value Ref Range Status   Specimen Description BLOOD LEFT ANTECUBITAL  Final   Special Requests BOTTLES DRAWN AEROBIC AND ANAEROBIC 5CC  Final   Culture   Final           BLOOD CULTURE RECEIVED NO GROWTH TO DATE CULTURE WILL BE HELD FOR 5 DAYS BEFORE ISSUING A FINAL NEGATIVE REPORT Performed at [facility]    Report Status PENDING  Incomplete  Culture, blood (routine x 2)     Status: None (Preliminary result)   Collection Time: 11/06/14  6:40 PM  Result Value Ref Range  Status   Specimen Description BLOOD LEFT HAND  Final   Special Requests BOTTLES DRAWN AEROBIC AND ANAEROBIC 5CC  Final    Culture   Final           BLOOD CULTURE RECEIVED NO GROWTH TO DATE CULTURE WILL BE HELD FOR 5 DAYS BEFORE ISSUING A FINAL NEGATIVE REPORT Performed at [facility]    Report Status PENDING  Incomplete   Studies/Results: No results found. Medications: I have reviewed the patient's current medications. Scheduled Meds: . cephALEXin  250 mg Oral Q12H  . chlorhexidine  15 mL Mouth Rinse BID  . feeding supplement (RESOURCE BREEZE)  1 Container Oral TID BM  . heparin subcutaneous  5,000 Units Subcutaneous 3 times per day  . sodium chloride  10-40 mL Intracatheter Q12H  . sodium chloride  3 mL Intravenous Q12H   Continuous Infusions: . dextrose 5 % with KCl 20 mEq / L 20 mEq (11/10/14 0520)   PRN Meds:.ondansetron **OR** ondansetron (ZOFRAN) IV, sodium chloride Assessment/Plan: Principal Problem:   Hypernatremia Active Problems:   Acute renal failure superimposed on stage 3 chronic kidney disease   UTI (urinary tract infection)   Atrial fibrillation with rapid ventricular response   Hypercalcemia   Osteopenia   Prediabetes   GERD (gastroesophageal reflux disease)   Allergic rhinitis   Diastolic dysfunction   Subclinical hyperthyroidism   Polyclonal gammopathy determined by serum protein electrophoresis   Metabolic encephalopathy   Bradycardia  81 yo female with hx of Jimenez here with worsening AMS and severe hypernatremia  Severe hypernatremia - Initial sodium 174-176l - Likely 2/2 to restricted PO intake of water as she is demented and also was in a nursing home. Was very dry on exam. Could also have partial diabetic insipidus as well.  Uosm 460 (lower than expected for volume depletion but could have decreased ability to maximally concentrate urine due to age. It is also higher than expected for DI. UNa 47 (higher than expected for volume depletion). May have an overlap of restricted water intake and partial DI. Having good UOP 1.5 - 2 L last 2 days. Una repeated 101,  Uosm 410.-total free water deficit on admission 8 liters.  - goal correction is 0.5 meq per hour. Now 151 goal 141 by tomorrow. We have been adjusting her fluids to meet the goal. Currently on D5W+KCL 125cc/hr. Will like discharge back to ALF once sodium reaches in 140's. -  appreciate nephrology recs - continue q8hr BMET -strict i/o, cont foley.  AMS - likely 2/2 to severe hypernatremia  Could also be from seizure 2/2 to hypernatremia. - does have left facial droop but unsure if this is acute or chronic. - Ct head negative, MRI negative. Likely not stroke. -appreciate neuro rec.  - fix hypernatremia as above.  Intermittent bradycardia as low as 20 BPM's with first degree block and PVC - consulted cardiology patient is DNR and with her poor prognosis she may not be a good candidate for pace maker. - f/up card recs: no intervention for now as she is very demented  Questionable Afib with RVR on presentation - resolved. EKG unclear if it was truly Afib. - was on dilt drip and heparin gtt . Both d/ced. Initially tachycardia was likely 2/2 to dehdyration.  - echo normal EF 60-65%, grade 1 diastolic dysfunction.   UTI - from [employer]. - was on ceftriaxone, now on Keflex  q12h based on her CrCL <30 and treat total Molly days.;   Hyperparathyroidism -  high PTH. Likely 2/2 to CKD? See above.  Hypercalcemia - corrected was 11 now normal.  - could be from immobilization, hypernatremia? PTH is high, phos is normal, likely secondary hyperthyroidism.  Sick euthyroid syndrome - low tsh 0.168, normal T4 1.0  Macrocytosis - hemoglobin normal 12.9. Unclear what baseline is. - was 15.3 hgb initiailly, improved with IVF. - MCV 108. Folate and b12 normal. TSH low but T4 normal. Unclear of the cause of macrocytosis.  DNR - confirmed with her state appointed guardian Desire Rogelio SeenMcCall [phone] or [phone].  Heparin dvt ppx Dysphagia 3 diet.    Dispo: Disposition is deferred at this  time, awaiting improvement of current medical problems.  Anticipated discharge in approximately 1-2 day(s).   The patient does have a current PCP (No primary care provider on file.) and [facility] after discharge.  The patient does have transportation limitations that hinder transportation to clinic appointments.  .Services Needed at time of discharge: Y = Yes, Blank = No PT:   OT:   RN:   Equipment:   Other:     LOS: 4 days   Hyacinth Meekerasrif Jimi Giza, MD 11/10/2014, Molly:37 AM

## 2014-11-10 NOTE — Care Management (Signed)
CARE MANAGEMENT NOTE 11/10/2014  Patient:  Molly Jimenez,Molly Jimenez   Account Number:  [id number]  Date Initiated:  11/10/2014  Documentation initiated by:  Juwan Vences  Subjective/Objective Assessment:   CM following for progression and d/c planning.     Action/Plan:   Pt is ALF resident, plan eval for possible return to ALF or SNF as recommended by PT/OT eval.   Anticipated DC Date:  11/13/2014   Anticipated DC Plan:  SKILLED NURSING FACILITY         Choice offered to / List presented to:             Status of service:  In process, will continue to follow Medicare Important Message given?  YES (If response is "NO", the following Medicare IM given date fields will be blank) Date Medicare IM given:  11/10/2014 Medicare IM given by:  Marki Frede Date Additional Medicare IM given:   Additional Medicare IM given by:    Discharge Disposition:    Per UR Regulation:    If discussed at Long Length of Stay Meetings, dates discussed:    Comments:

## 2014-11-10 NOTE — Evaluation (Signed)
Physical Therapy Evaluation Patient Details Name: Molly Jimenez MRN: [id number] DOB: [birth year] Today's Date: 11/10/2014   History of Present Illness  Pt admit for hypernatremia and UTI.   Clinical Impression  Pt admitted with above diagnosis. Pt currently with functional limitations due to the deficits listed below (see PT Problem List). Pt requires +2 person total to max assist for mobility. May need NHP.  Will follow acutely.   Pt will benefit from skilled PT to increase their independence and safety with mobility to allow discharge to the venue listed below.      Follow Up Recommendations SNF;Supervision/Assistance - 24 hour (Unless A living can provide mod to max assist for mobility)    Equipment Recommendations  Other (comment) (TBA)    Recommendations for Other Services       Precautions / Restrictions Precautions Precautions: Fall Restrictions Weight Bearing Restrictions: No      Mobility  Bed Mobility Overal bed mobility: Needs Assistance;+2 for physical assistance Bed Mobility: Supine to Sit     Supine to sit: Max assist;+2 for physical assistance     General bed mobility comments: P{t needed assist to move LEs off bed and for elevation of trunk.   Transfers                 General transfer comment: unable  Ambulation/Gait             General Gait Details: unable  Stairs            Wheelchair Mobility    Modified Rankin (Stroke Patients Only)       Balance Overall balance assessment: Needs assistance Sitting-balance support: Bilateral upper extremity supported;Feet supported Sitting balance-Leahy Scale: Zero Sitting balance - Comments: Pt could not sit up with less than max asssit leaning posteriorly and to her left. Sat about 1 1/2 min and had to lie down.  Notified nursing pt was soiled as well  Postural control: Posterior lean;Left lateral lean                                   Pertinent Vitals/Pain Pain  Assessment: Faces Faces Pain Scale: No hurt  VSS    Home Living Family/patient expects to be discharged to:: Assisted living                 Additional Comments: Chart states A living    Prior Function           Comments: Unsure as no family present and pt only utters one or two words      Hand Dominance        Extremity/Trunk Assessment   Upper Extremity Assessment: Defer to OT evaluation           Lower Extremity Assessment: RLE deficits/detail;LLE deficits/detail RLE Deficits / Details: appears grossly 3-/5 LLE Deficits / Details: appears grossly 3-/5     Communication   Communication: Expressive difficulties;HOH  Cognition Arousal/Alertness: Lethargic Behavior During Therapy: Flat affect Overall Cognitive Status: History of cognitive impairments - at baseline                      General Comments      Exercises        Assessment/Plan    PT Assessment Patient needs continued PT services  PT Diagnosis Generalized weakness   PT Problem List Decreased activity tolerance;Decreased balance;Decreased strength;Decreased range of motion;Decreased mobility;Decreased knowledge  of use of DME;Decreased safety awareness;Decreased knowledge of precautions;Decreased cognition  PT Treatment Interventions DME instruction;Gait training;Functional mobility training;Therapeutic activities;Therapeutic exercise;Balance training;Patient/family education   PT Goals (Current goals can be found in the Care Plan section) Acute Rehab PT Goals Patient Stated Goal: unable to state PT Goal Formulation: Patient unable to participate in goal setting Time For Goal Achievement: 11/24/14 Potential to Achieve Goals: Fair    Frequency Min 2X/week   Barriers to discharge Decreased caregiver support      Co-evaluation               End of Session   Activity Tolerance: Patient limited by fatigue;Patient limited by lethargy Patient left: in bed;with call  bell/phone within reach;with bed alarm set Nurse Communication: Mobility status;Need for lift equipment         Time: 1610-9604 PT Time Calculation (min) (ACUTE ONLY): 12 min   Charges:   PT Evaluation $Initial PT Evaluation Tier I: 1 Procedure     PT G CodesBerline Lopes 11/12/2014, 5:07 PM Manuel Dall [facility] Acute Rehabilitation [phone] (pager)

## 2014-11-10 NOTE — Clinical Social Work Note (Signed)
CSW has left voicemail with Cleotis NipperBob Cauthren (APS social worker) in attempts to speak with him about the patient's discharge plan. If an on call social worker needs to be contact after hours, you may call [phone]. CSW has spoken with [facility] [place]. They have asked that clinical information be faxed over for review. The facility is anticipating being able to accept patient back on Saturday. They ask that weekend CSW contact the facility to confirm that everything is in place for the patient to return this weekend before sending her. CSW will leave handoff for weekend CSW.   Roddie McBryant Tawonna Esquer MSW, [place], [place], [phone]

## 2014-11-10 NOTE — Clinical Social Work Note (Signed)
Clinical Social Work Assessment  Patient Details  Name: Molly JacksonGrace E Jimenez MRN: [id number] Date of Birth: 12-20-1925  Date of referral:  11/10/14               Reason for consult:  Discharge Planning, Other (Comment Required) (Patient has guardian with [facility])                Permission sought to share information with:  Other (Patient unable to give permission. ) Permission granted to share information::  No  Name::        Agency::     Relationship::     Contact Information:     Housing/Transportation Living arrangements for the past 2 months:  Assisted Living Facility ([facility] [place] p:[phone] f: [phone]) Source of Information:  Facility, Case Manager Patient Interpreter Needed:  None Criminal Activity/Legal Involvement Pertinent to Current Situation/Hospitalization:  No - Comment as needed Significant Relationships:  Other(Comment) (Patient has guardian with [facility]) Lives with:  Facility Resident Do you feel safe going back to the place where you live?  Yes Need for family participation in patient care:  No (Coment)  Care giving concerns:  CSW left messages with patient's appointed guardian Cleotis NipperBob Cauthren. CSW received return call from a Lazaro ArmsKay Flack with APS. SHe is not able to provide much information on patient.    Social Worker assessment / plan:  CSW was able to collect collateral information from facility and APS case manager Lazaro ArmsKay Flack. Facility nurse states that the patient is a resident of their facility and they plan to accept the patient back at discharge. Per MD the facility was provided extensive assistance to patient PTA. Lazaro ArmsKay Flack shares that Nadine CountsBob, the patient's guardian is not currently available. Joyce GrossKay states that CSW needs to leave message stating when patient is discharged. If weekend CSW needs to contact an on call or after hours social worker, they can be reached at [phone].  Employment status:  Retired Chief Financial Officernsurance  information:  Systems analystManaged Medicare (UHC MEDICARE) PT Recommendations:  Skilled Nursing Facility, 24 Hour Supervision Information / Referral to community resources:  Other (Comment Required) (Referral has been sent to [facility].)  Patient/Family's Response to care:  Unable to assess.  Patient/Family's Understanding of and Emotional Response to Diagnosis, Current Treatment, and Prognosis:  Unable to assess.  Emotional Assessment Appearance:  Appears stated age, Other (Comment Required (Patient sleeping when CSW visited room) Attitude/Demeanor/Rapport:  Unable to Assess (CSW tried engaging with patient but patient's engagement is very minimal) Affect (typically observed):  Unable to Assess Orientation:   (Unable to assess.) Alcohol / Substance use:  Never Used Psych involvement (Current and /or in the community):  No (Comment)  Discharge Needs  Concerns to be addressed:  Discharge Planning Concerns Readmission within the last 30 days:  No Current discharge risk:  None Barriers to Discharge:  Continued Medical Work up   The KrogerBryant Icy Fuhrmann MSW, [place] MelLCSWA, [place], [phone]

## 2014-11-10 NOTE — Progress Notes (Signed)
Patient seen and examined. Case d/w residents in detail. I agree with findings and plan as documented in Dr. Marcelino FreestoneAhmed's note.  Patient is alert. Says 1-2 words at a time.   Hypernatremia is resolving. Would c/w IVF today- correct no more than 12 in 24 hours. Encourage PO intake. Nephro signed off. Likely d/c back to ALF once Na normalizes.  C/w keflex to treat UTI for 7 day total treatment.  No further intervention for bradycardia per cardio. Will monitor.

## 2014-11-10 NOTE — Progress Notes (Signed)
Patients  Went down to upper 40's for several minutes,amplitudes  was weak,mistakently as asystole.On assessesment patient opened her eyes  On verbal command,yawning,skin warm and dry.Respiration 12/min and her apical pulse  was 53/min. Will monitor.

## 2014-11-10 NOTE — Progress Notes (Signed)
Placed order to d/c tele per dayshift RN conversation  [facility]  4/28. Roxanne MinsAlbert Rio.

## 2014-11-11 DIAGNOSIS — B9689 Other specified bacterial agents as the cause of diseases classified elsewhere: Secondary | ICD-10-CM

## 2014-11-11 LAB — BASIC METABOLIC PANEL
ANION GAP: 8 (ref 5–15)
BUN: 13 mg/dL (ref 6–23)
CHLORIDE: 111 mmol/L (ref 96–112)
CO2: 25 mmol/L (ref 19–32)
Calcium: 8.4 mg/dL (ref 8.4–10.5)
Creatinine, Ser: 1.01 mg/dL (ref 0.50–1.10)
GFR calc Af Amer: 57 mL/min — ABNORMAL LOW (ref >90)
GFR calc non Af Amer: 49 mL/min — ABNORMAL LOW (ref >90)
Glucose, Bld: 97 mg/dL (ref 70–99)
Potassium: 4.3 mmol/L (ref 3.5–5.1)
SODIUM: 144 mmol/L (ref 135–145)

## 2014-11-11 MED ORDER — CEPHALEXIN 250 MG/5ML PO SUSR: 250.0000 mg | Freq: Two times a day (BID) | ORAL | Status: AC

## 2014-11-11 NOTE — Discharge Instructions (Signed)
Take keflex for 3 more days to finish treatment for UTI  You must drink water, nursing home/SNF staff should provider patient water every 2 hours and to check on her.

## 2014-11-11 NOTE — Discharge Summary (Signed)
Name: Molly Jimenez MRN: [id number] DOB: [birth year] 81 y.o. PCP: No primary care provider on file.  Date of Admission: 11/06/2014  9:46 AM Date of Discharge: 11/11/2014 Attending Physician: Inez Catalina, MD  Discharge Medications:   Medication List    STOP taking these medications        HYDROcodone-acetaminophen 5-325 MG per tablet  Commonly known as:  NORCO/VICODIN      TAKE these medications        acetaminophen 500 MG tablet  Commonly known as:  TYLENOL  Take 500 mg by mouth 3 (three) times daily as needed (pain).     aspirin 81 MG chewable tablet  Chew 81 mg by mouth every morning.     atorvastatin 10 MG tablet  Commonly known as:  LIPITOR  Take 10 mg by mouth every morning.     betamethasone dipropionate 0.05 % cream  Commonly known as:  DIPROLENE  See admin instructions. Apply a thin layer topically to areas of redness/scaling on ankle, knees, back of ands, and low back twice daily as needed for redness/scaling     Calcium Carbonate-Vitamin D 600-400 MG-UNIT per tablet  Take 1 tablet by mouth 2 (two) times daily.     CENTRUM SILVER PO  Take 1 tablet by mouth every morning.     cephALEXin 250 MG/5ML suspension  Commonly known as:  KEFLEX  Take 5 mLs (250 mg total) by mouth every 12 (twelve) hours.     citalopram 20 MG tablet  Commonly known as:  CELEXA  Take 20 mg by mouth every morning.     feeding supplement Liqd  Take 1 Container by mouth every morning.     ketoconazole 2 % shampoo  Commonly known as:  NIZORAL  See admin instructions. Use as directed three times weekly (Monday, Wednesday, and Friday) with shower     loratadine 10 MG tablet  Commonly known as:  CLARITIN  Take 10 mg by mouth daily as needed for allergies.     memantine 10 MG tablet  Commonly known as:  NAMENDA  Take 10 mg by mouth 2 (two) times daily.     omeprazole 20 MG capsule  Commonly known as:  PRILOSEC  Take 20 mg by mouth daily as needed (heartburn).     OVER  THE COUNTER MEDICATION  Take 4 oz by mouth every 2 (two) hours. Encourage 4oz fluids     polyethylene glycol packet  Commonly known as:  MIRALAX / GLYCOLAX  Take 17 g by mouth every morning.     triamcinolone cream 0.1 %  Commonly known as:  KENALOG  See admin instructions. Apply to affected areas of rash at bedtime        Disposition and follow-up:   Ms.Laconda E Jimenez was discharged from [facility] in Stable condition.  At the hospital follow up visit please address:  1.  It is very important that the staff at the SNF offers patient water every 2 hours to avoid any future episodes of dehydration leading to hypernatremia.  Please check BMET in 2-3 days to make sure sodium is stable.  Finish course of keflex for 3 more days for UTI (total 7 days)  2.  Labs / imaging needed at time of follow-up: BMET  3.  Pending labs/ test needing follow-up:    Follow-up Appointments:   Discharge Instructions:   Consultations: Treatment Team:  Arita Miss, MD  Procedures Performed:  Ct Head Wo Contrast  11/06/2014   CLINICAL DATA:  Unable to eat. Right-sided neck pain when trying to straighten.  EXAM: CT HEAD WITHOUT CONTRAST  CT CERVICAL SPINE WITHOUT CONTRAST  TECHNIQUE: Multidetector CT imaging of the head and cervical spine was performed following the standard protocol without intravenous contrast. Multiplanar CT image reconstructions of the cervical spine were also generated.  COMPARISON:  Head CT 07/04/2011  FINDINGS: CT HEAD FINDINGS  Skull and Sinuses:Negative for fracture or destructive process. The mastoids, middle ears, and imaged paranasal sinuses are clear.  Orbits: No acute abnormality.  Brain: No evidence of acute infarction, hemorrhage, hydrocephalus, or mass lesion/mass effect. Generalized brain atrophy with ventriculomegaly. Brain atrophy which is progressed from 2012. Ventriculomegaly is likely from central predominant volume loss. There is prominent  mesial temporal lobe atrophy, pattern which can be seen with Alzheimer's disease. Chronic small vessel disease ischemic gliosis throughout the bilateral cerebral white matter, also progressed from prior.  CT CERVICAL SPINE FINDINGS  No fracture or subluxation. No erosion of the endplates with facets. No gross cervical canal hematoma or prevertebral edema. The muscular or ligamentous ossification.  Profound osteopenia. There is degenerative disc disease which is focally advanced at C5-6. Despite endplate spurs node notable canal stenosis. Facet ankylosis bilaterally at C4-5 and C5-6. There is advanced degeneration at the C1-C2 articulation with bulky spurring about the atlanto dental interval.  Bilateral submandibular sialolithiasis.  No acute inflammation.  Aberrant right subclavian artery, partly visualized.  IMPRESSION: 1. No acute intracranial or cervical spine findings. 2. Progressive brain atrophy and chronic small vessel disease.   Electronically Signed   By: Marnee SpringJonathon  Watts M.D.   On: 11/06/2014 12:01   Ct Cervical Spine Wo Contrast  11/06/2014   CLINICAL DATA:  Unable to eat. Right-sided neck pain when trying to straighten.  EXAM: CT HEAD WITHOUT CONTRAST  CT CERVICAL SPINE WITHOUT CONTRAST  TECHNIQUE: Multidetector CT imaging of the head and cervical spine was performed following the standard protocol without intravenous contrast. Multiplanar CT image reconstructions of the cervical spine were also generated.  COMPARISON:  Head CT 07/04/2011  FINDINGS: CT HEAD FINDINGS  Skull and Sinuses:Negative for fracture or destructive process. The mastoids, middle ears, and imaged paranasal sinuses are clear.  Orbits: No acute abnormality.  Brain: No evidence of acute infarction, hemorrhage, hydrocephalus, or mass lesion/mass effect. Generalized brain atrophy with ventriculomegaly. Brain atrophy which is progressed from 2012. Ventriculomegaly is likely from central predominant volume loss. There is prominent mesial  temporal lobe atrophy, pattern which can be seen with Alzheimer's disease. Chronic small vessel disease ischemic gliosis throughout the bilateral cerebral white matter, also progressed from prior.  CT CERVICAL SPINE FINDINGS  No fracture or subluxation. No erosion of the endplates with facets. No gross cervical canal hematoma or prevertebral edema. The muscular or ligamentous ossification.  Profound osteopenia. There is degenerative disc disease which is focally advanced at C5-6. Despite endplate spurs node notable canal stenosis. Facet ankylosis bilaterally at C4-5 and C5-6. There is advanced degeneration at the C1-C2 articulation with bulky spurring about the atlanto dental interval.  Bilateral submandibular sialolithiasis.  No acute inflammation.  Aberrant right subclavian artery, partly visualized.  IMPRESSION: 1. No acute intracranial or cervical spine findings. 2. Progressive brain atrophy and chronic small vessel disease.   Electronically Signed   By: Marnee SpringJonathon  Watts M.D.   On: 11/06/2014 12:01   Mr Brain Wo Contrast  11/06/2014   CLINICAL DATA:  81 year old female with hypercholesterolemia presenting with increased confusion over the past week. Initial  encounter.  EXAM: MRI HEAD WITHOUT CONTRAST  TECHNIQUE: Multiplanar, multiecho pulse sequences of the brain and surrounding structures were obtained without intravenous contrast.  COMPARISON:  Several prior CTs, most recent 11/06/2014 and most remote 09/01/2008.  FINDINGS: Exam is motion degraded.  No acute infarct.  No intracranial hemorrhage.  Moderate small vessel disease type changes.  Global prominent atrophy. Ventricular prominence may be related to atrophy although difficult to completely exclude a component of superimposed mild hydrocephalus. The degree of ventricular prominence has progressed since most remote exam of 2010.  No intracranial mass lesion noted on this unenhanced exam.  Major intracranial vascular structures are patent.  Cervical  medullary junction, pituitary region, pineal region and orbital structures unremarkable.  IMPRESSION: Exam is motion degraded.  No acute infarct.  Moderate small vessel disease type changes.  Global prominent atrophy. Ventricular prominence may be related to atrophy although difficult to completely exclude a component of superimposed mild hydrocephalus. The degree of ventricular prominence has progressed since most remote exam of 2010.   Electronically Signed   By: Lacy Duverney M.D.   On: 11/06/2014 16:52   US Abdomen Complete  11/10/2014   CLINICAL DATA:  Abdominal pain  EXAM: ULTRASOUND ABDOMEN COMPLETE  COMPARISON:  Renal ultrasound November 07, 2014  FINDINGS: Gallbladder: No gallstones or wall thickening visualized. There is no pericholecystic fluid. No sonographic Murphy sign noted.  Common bile duct: Diameter: 4 mm. There is no intrahepatic, common hepatic, or common bile duct dilatation.  Liver: No focal lesion identified. Within normal limits in parenchymal echogenicity.  IVC: No abnormality visualized.  Pancreas: Visualized portion unremarkable. Portions of the pancreas are obscured by gas.  Spleen: Size and appearance within normal limits.  Right Kidney: Length: 10.1 cm. Echogenicity is slightly increased. There is renal cortical thinning. No mass or hydronephrosis visualized.  Left Kidney: Length: 10.1 cm. Echogenicity is slightly increased. There is renal cortical thinning. No mass or hydronephrosis visualized.  Abdominal aorta: No aneurysm visualized.  Other findings: No demonstrable ascites.  IMPRESSION: Kidneys show mildly increased echogenicity and renal cortical thinning. Question medical renal disease. No renal obstruction or identified on either side. Portions of the pancreas are obscured by gas. Visualized portions of pancreas appear normal. Study otherwise unremarkable.   Electronically Signed   By: Bretta Bang III M.D.   On: 11/10/2014 15:45   US Renal  11/07/2014   CLINICAL DATA:   Rising creatinine.  Renal disorder.  EXAM: RENAL / URINARY TRACT ULTRASOUND COMPLETE  COMPARISON:  None.  FINDINGS: Right Kidney:  Length: 9.7 cm. Renal cortical thinning with increased renal cortical echogenicity. No mass or hydronephrosis visualized.  Left Kidney:  Length: 9.7 cm. Renal cortical thinning with increased renal cortical echogenicity. No mass or hydronephrosis visualized.  Bladder:  Decompressed bladder with a Foley catheter present.  IMPRESSION: 1. No obstructive uropathy. 2. Bilateral renal cortical thinning and increased renal cortical echogenicity as can be seen with medical renal disease.   Electronically Signed   By: Elige Ko   On: 11/07/2014 19:41   Dg Chest Portable 1 View  11/06/2014   CLINICAL DATA:  Weakness and dysphagia.  Left facial droop.  EXAM: PORTABLE CHEST - 1 VIEW  COMPARISON:  PA and lateral chest 07/04/2011 and 08/04/2008.  FINDINGS: The lungs are clear. Heart size is normal. No pneumothorax or pleural effusion. Vertebral augmentation lower thoracic spine is again seen. Degenerative disease about the shoulders is worse on the right.  IMPRESSION: No acute disease.   Electronically  Signed   By: Drusilla Kanner M.D.   On: 11/06/2014 11:07    Admission HPI:   81 yo female with no reported PMH here with AMS from ALF. Patient has been having some increased confusion for last 1 week. Today during breakfast, the aide found her to be nonresponsive, just staring, with some shaking of her extremities. Unsure if she had a seizure. Aide did see some left facial droop. No hx of recent falls. I could not obtain full history. I called the ALF but there was no nurse available, I got brief history from the receptionist.   Was found to be in Afib RVR 137 . Was placed on Dilt drip. Rate improved.   Patient denies any complaint to [place]. She only knows her name. Denies any n/v/diarrhea. Not able to provide much history. Is following some commands.    Hospital Course by problem  list:   81 yo female with hx of dementia here with worsening AMS and severe hypernatremia  Severe hypernatremia - Initial sodium 174-176l - Likely 2/2 to restricted PO intake of water as she is demented and also was in a nursing home. Was very dry on exam. Could also have partial diabetic insipidus as well.  Uosm 460 (lower than expected for volume depletion but could have decreased ability to maximally concentrate urine due to age. It is also higher than expected for DI. UNa 47 (higher than expected for volume depletion). May have an overlap of restricted water intake and partial DI. Having good UOP 1.5 - 2 L last 2 days. Una repeated 101, Uosm 410.-total free water deficit on admission 8 liters.  - corrected with fluids initially NS then with D5W once volume corrected. - sodium to 147 today. Will discharge with this. Will ask SNF to make sure she gets water every 2 hours and to check BMET. - appreciate nephrology recs.   AMS - likely 2/2 to severe hypernatremia  Could also be from seizure 2/2 to hypernatremia. - does have left facial droop but unsure if this is acute or chronic. - Ct head negative, MRI negative. Likely not stroke. - probably back to her baseline now. Not very talkative at baseline. Ready for discharge.   Intermittent bradycardia as low as 20 BPM's with first degree block and PVC - consulted cardiology patient is DNR and with her poor prognosis she may not be a good candidate for pace maker. - consulted cards: no intervention for now as she is very demented  Questionable Afib with RVR on presentation - resolved. EKG unclear if it was truly Afib. - was on dilt drip and heparin gtt . Both d/ced. Initially tachycardia was likely 2/2 to dehdyration.  - echo normal EF 60-65%, grade 1 diastolic dysfunction.   UTI - from [employer]. - was on ceftriaxone, now on Keflex 250mg  q12h based on her CrCL <30 and treat total 7 days.  Hyperparathyroidism - high PTH. Likely  2/2 to CKD? See above.  Hypercalcemia - resolved. Initially Ca2+ was 11, now normal.  - could be from immobilization, hypernatremia? PTH is high, phos is normal, likely secondary hyperthyroidism.  Sick euthyroid syndrome - low tsh 0.168, normal T4 1.0   Macrocytosis - hemoglobin normal 12.9. Unclear what baseline is. - was 15.3 hgb initiailly, improved with IVF. - MCV 108. Folate and b12 normal. TSH low but T4 normal. Unclear of the cause of macrocytosis.  DNR - confirmed with her state appointed guardian Desire Rogelio Seen [phone] or [phone].  Heparin dvt  ppx Dysphagia 3 diet. advance as tolerates.   Discharge Vitals:   BP 106/55 mmHg  Pulse 66  Temp(Src) 98.5 F (36.9 C) (Oral)  Resp 18  Ht  (1.575 m)  Wt 132 lb 7.9 oz (60.1 kg)  BMI 24.23 kg/m2  SpO2 98%  Discharge Labs:  Results for orders placed or performed during the hospital encounter of 11/06/14 (from the past 24 hour(s))  Basic metabolic panel     Status: Abnormal   Collection Time: 11/10/14  7:59 PM  Result Value Ref Range   Sodium 147 (H) 135 - 145 mmol/L   Potassium 4.4 3.5 - 5.1 mmol/L   Chloride 117 (H) 96 - 112 mmol/L   CO2 21 19 - 32 mmol/L   Glucose, Bld 102 (H) 70 - 99 mg/dL   BUN 16 6 - 23 mg/dL   Creatinine, Ser 1.09 0.50 - 1.10 mg/dL   Calcium 8.0 (L) 8.4 - 10.5 mg/dL   GFR calc non Af Amer 46 (L) >90 mL/min   GFR calc Af Amer 54 (L) >90 mL/min   Anion gap 9 5 - 15    Signed: Hyacinth Meeker, MD 11/11/2014, 1:36 PM    Services Ordered on Discharge:  Equipment Ordered on Discharge:

## 2014-11-11 NOTE — Progress Notes (Addendum)
CSW Proofreader(Clinical Social Worker) spoke with facility nurse and confirmed they received faxed dc summary and FL2. Facility confirmed they can manage pt care and pt is okay to discharge back today. CSW left message on multiple APS phone numbers to notify of pt dc back to facility. CSW prepared pt dc packet and placed with shadow chart. CSW arranged non-emergent ambulance transport. Pt nurse and facility informed. CSW signing off.   ADDENDUM: CSW received call from pt nurse asking that transport be delayed. CSW cancelled transport. If plan remains for pt to dc today, pt nurse will call for non-emergency ambulance pick-up. CSW provided with phone number.   Molly Jimenez, LCSWA Weekend CSW [phone]

## 2014-11-11 NOTE — Progress Notes (Signed)
Called [facility] for patient's transfort to her facility.

## 2014-11-11 NOTE — Progress Notes (Signed)
Subjective: Only says 1-2 words occasionally. Otherwise remains quite but calm. Afebrile overnight. Sodium correcting slowly 147 today. Ab u/s showed medical kidney dz only.   Objective: Vital signs in last 24 hours: Filed Vitals:   11/10/14 0914 11/10/14 2118 11/11/14 0417 11/11/14 0800  BP: 128/50 153/69 114/52 106/55  Pulse: 50 87 52 66  Temp: 98.2 F (36.8 C) 97.4 F (36.3 C) 98.1 F (36.7 C) 98.5 F (36.9 C)  TempSrc: Oral Oral Oral Oral  Resp: 16 17 19 18   Height:      Weight:  132 lb 7.9 oz (60.1 kg)    SpO2: 100% 100% 100% 98%   Weight change: 1 lb 5.2 oz (0.6 kg)  Intake/Output Summary (Last 24 hours) at 11/11/14 1308 Last data filed at 11/11/14 1254  Gross per 24 hour  Intake    190 ml  Output   2850 ml  Net  -2660 ml   Vitals reviewed. General: resting in bed, confused, follows some commands. HEENT: PERRL, EOMI, no scleral icterus. Oral mucosa moist  Cardiac: RRR, no rubs, murmurs or gallops Pulm: clear to auscultation bilaterally, no wheezes, rales, or rhonchi Abd: soft, continues to have some tenderness on RUQ Ext: warm and well perfused, no pedal edema Neuro: alert and oriented to self only, mild left sided facial droop. Moves all ext equally.   Lab Results: Basic Metabolic Panel:  Recent Labs Lab 11/06/14 1332  11/07/14 0138  11/10/14 0153 11/10/14 1959  NA  --   < > 174*  < > 151* 147*  K  --   < > 3.4*  < > 4.1 4.4  CL  --   < > >130*  < > 122* 117*  CO2  --   < > 25  < > 22 21  GLUCOSE  --   < > 105*  < > 105* 102*  BUN  --   < > 53*  < > 24* 16  CREATININE  --   < > 1.60*  < > 1.11* 1.06  CALCIUM  --   < > 8.8  < > 7.6* 8.0*  MG 3.0*  --  2.5  --   --   --   PHOS 2.8  --   --   --   --   --   < > = values in this interval not displayed. Liver Function Tests:  Recent Labs Lab 11/06/14 1102 11/07/14 0138  AST 40* 36  ALT 31 33  ALKPHOS 70 59  BILITOT 1.1 0.9  PROT 7.8 5.9*  ALBUMIN 3.3* 2.6*   No results for input(s): LIPASE,  AMYLASE in the last 168 hours. No results for input(s): AMMONIA in the last 168 hours. CBC:  Recent Labs Lab 11/06/14 1102  11/09/14 0329 11/10/14 0153  WBC 15.6*  < > 11.1* 10.5  NEUTROABS 12.5*  --   --   --   HGB 15.3*  < > 11.4* 12.6  HCT 49.8*  < > 37.7 38.8  MCV 105.7*  < > 103.6* 101.3*  PLT 224  < > 153 133*  < > = values in this interval not displayed. Cardiac Enzymes:  Recent Labs Lab 11/06/14 1332 11/06/14 1840 11/07/14 0138  CKTOTAL  --  55  --   TROPONINI 0.06* 0.07* 0.06*   CBG:  Recent Labs Lab 11/08/14 1953 11/08/14 2352 11/09/14 0453 11/09/14 0806 11/09/14 1140 11/09/14 2004  GLUCAP 113* 128* 92 81 106* 164*   Hemoglobin A1C:  Recent Labs Lab 11/06/14 1333  HGBA1C 5.7*   Fasting Lipid Panel:  Recent Labs Lab 11/06/14 1335  CHOL 147  HDL 30*  LDLCALC 96  TRIG 161  CHOLHDL 4.9   Thyroid Function Tests:  Recent Labs Lab 11/06/14 1333 11/06/14 1840  TSH 0.168*  --   FREET4  --  1.00   Coagulation:  Recent Labs Lab 11/06/14 1102  LABPROT 15.1  INR 1.18   Anemia Panel:  Recent Labs Lab 11/06/14 1840  VITAMINB12 1202*  FOLATE >20.0   Urine Drug Screen: Drugs of Abuse     Component Value Date/Time   LABOPIA POSITIVE* 11/06/2014 1054   COCAINSCRNUR NONE DETECTED 11/06/2014 1054   LABBENZ NONE DETECTED 11/06/2014 1054   AMPHETMU NONE DETECTED 11/06/2014 1054   THCU NONE DETECTED 11/06/2014 1054   LABBARB NONE DETECTED 11/06/2014 1054    Alcohol Level:  Recent Labs Lab 11/06/14 1102  ETH <5   Urinalysis:  Recent Labs Lab 11/06/14 1054  COLORURINE YELLOW  LABSPEC 1.013  PHURINE 5.5  GLUCOSEU NEGATIVE  HGBUR SMALL*  BILIRUBINUR NEGATIVE  KETONESUR NEGATIVE  PROTEINUR 30*  UROBILINOGEN 0.2  NITRITE POSITIVE*  LEUKOCYTESUR LARGE*   Micro Results: Recent Results (from the past 240 hour(s))  Urine culture     Status: None   Collection Time: 11/06/14 10:54 AM  Result Value Ref Range Status    Specimen Description URINE, RANDOM  Final   Special Requests NONE  Final   Colony Count   Final    >=100,000 COLONIES/ML Performed at [facility]    Culture   Final    ESCHERICHIA COLI Performed at [facility]    Report Status 11/08/2014 FINAL  Final   Organism ID, Bacteria ESCHERICHIA COLI  Final      Susceptibility   Escherichia coli - MIC*    AMPICILLIN <=2 SENSITIVE Sensitive     CEFAZOLIN <=4 SENSITIVE Sensitive     CEFTRIAXONE <=1 SENSITIVE Sensitive     CIPROFLOXACIN <=0.25 SENSITIVE Sensitive     GENTAMICIN <=1 SENSITIVE Sensitive     LEVOFLOXACIN <=0.12 SENSITIVE Sensitive     NITROFURANTOIN <=16 SENSITIVE Sensitive     TOBRAMYCIN <=1 SENSITIVE Sensitive     TRIMETH/SULFA <=20 SENSITIVE Sensitive     PIP/TAZO <=4 SENSITIVE Sensitive     * ESCHERICHIA COLI  MRSA PCR Screening     Status: None   Collection Time: 11/06/14  6:00 PM  Result Value Ref Range Status   MRSA by PCR NEGATIVE NEGATIVE Final    Comment:        The GeneXpert MRSA Assay (FDA approved for NASAL specimens only), is one component of a comprehensive MRSA colonization surveillance program. It is not intended to diagnose MRSA infection nor to guide or monitor treatment for MRSA infections.   Culture, blood (routine x 2)     Status: None (Preliminary result)   Collection Time: 11/06/14  6:30 PM  Result Value Ref Range Status   Specimen Description BLOOD LEFT ANTECUBITAL  Final   Special Requests BOTTLES DRAWN AEROBIC AND ANAEROBIC 5CC  Final   Culture   Final           BLOOD CULTURE RECEIVED NO GROWTH TO DATE CULTURE WILL BE HELD FOR 5 DAYS BEFORE ISSUING A FINAL NEGATIVE REPORT Performed at [facility]    Report Status PENDING  Incomplete  Culture, blood (routine x 2)     Status: None (Preliminary result)   Collection Time: 11/06/14  6:40 PM  Result Value Ref Range Status   Specimen Description BLOOD LEFT HAND  Final   Special Requests BOTTLES DRAWN AEROBIC AND  ANAEROBIC 5CC  Final   Culture   Final           BLOOD CULTURE RECEIVED NO GROWTH TO DATE CULTURE WILL BE HELD FOR 5 DAYS BEFORE ISSUING A FINAL NEGATIVE REPORT Performed at [facility]    Report Status PENDING  Incomplete   Studies/Results: US Abdomen Complete  11/10/2014   CLINICAL DATA:  Abdominal pain  EXAM: ULTRASOUND ABDOMEN COMPLETE  COMPARISON:  Renal ultrasound November 07, 2014  FINDINGS: Gallbladder: No gallstones or wall thickening visualized. There is no pericholecystic fluid. No sonographic Murphy sign noted.  Common bile duct: Diameter: 4 mm. There is no intrahepatic, common hepatic, or common bile duct dilatation.  Liver: No focal lesion identified. Within normal limits in parenchymal echogenicity.  IVC: No abnormality visualized.  Pancreas: Visualized portion unremarkable. Portions of the pancreas are obscured by gas.  Spleen: Size and appearance within normal limits.  Right Kidney: Length: 10.1 cm. Echogenicity is slightly increased. There is renal cortical thinning. No mass or hydronephrosis visualized.  Left Kidney: Length: 10.1 cm. Echogenicity is slightly increased. There is renal cortical thinning. No mass or hydronephrosis visualized.  Abdominal aorta: No aneurysm visualized.  Other findings: No demonstrable ascites.  IMPRESSION: Kidneys show mildly increased echogenicity and renal cortical thinning. Question medical renal disease. No renal obstruction or identified on either side. Portions of the pancreas are obscured by gas. Visualized portions of pancreas appear normal. Study otherwise unremarkable.   Electronically Signed   By: Bretta Bang III M.D.   On: 11/10/2014 15:45   Medications: I have reviewed the patient's current medications. Scheduled Meds: . cephALEXin  250 mg Oral Q12H  . chlorhexidine  15 mL Mouth Rinse BID  . feeding supplement (RESOURCE BREEZE)  1 Container Oral TID BM  . heparin subcutaneous  5,000 Units Subcutaneous 3 times per day  . sodium  chloride  10-40 mL Intracatheter Q12H  . sodium chloride  3 mL Intravenous Q12H   Continuous Infusions: . dextrose 5 % with KCl 20 mEq / L 20 mEq (11/10/14 2241)   PRN Meds:.ondansetron **OR** ondansetron (ZOFRAN) IV, sodium chloride Assessment/Plan: Principal Problem:   Hypernatremia Active Problems:   Acute renal failure superimposed on stage 3 chronic kidney disease   UTI (urinary tract infection)   Atrial fibrillation with rapid ventricular response   Hypercalcemia   Osteopenia   Prediabetes   GERD (gastroesophageal reflux disease)   Allergic rhinitis   Diastolic dysfunction   Subclinical hyperthyroidism   Polyclonal gammopathy determined by serum protein electrophoresis   Metabolic encephalopathy   Bradycardia   Secondary hyperparathyroidism of renal origin  81 yo female with hx of dementia here with worsening AMS and severe hypernatremia  Severe hypernatremia - Initial sodium 174-176l - Likely 2/2 to restricted PO intake of water as she is demented and also was in a nursing home. Was very dry on exam. Could also have partial diabetic insipidus as well.  Uosm 460 (lower than expected for volume depletion but could have decreased ability to maximally concentrate urine due to age. It is also higher than expected for DI. UNa 47 (higher than expected for volume depletion). May have an overlap of restricted water intake and partial DI. Having good UOP 1.5 - 2 L last 2 days. Una repeated 101, Uosm 410.-total free water deficit on admission 8 liters.  -  corrected with fluids initially NS then with D5W once volume corrected. - sodium to 147 today. Will discharge with this. Will ask SNF to make sure she gets water every 2 hours and to check BMET. -  appreciate nephrology recs  AMS - likely 2/2 to severe hypernatremia  Could also be from seizure 2/2 to hypernatremia. - does have left facial droop but unsure if this is acute or chronic. - Ct head negative, MRI negative. Likely not  stroke. - probably back to her baseline now. Not very talkative at baseline.  Intermittent bradycardia as low as 20 BPM's with first degree block and PVC - consulted cardiology patient is DNR and with her poor prognosis she may not be a good candidate for pace maker. - f/up card recs: no intervention for now as she is very demented  Questionable Afib with RVR on presentation - resolved. EKG unclear if it was truly Afib. - was on dilt drip and heparin gtt . Both d/ced. Initially tachycardia was likely 2/2 to dehdyration.  - echo normal EF 60-65%, grade 1 diastolic dysfunction.   UTI - from [employer]. - was on ceftriaxone, now on Keflex  q12h based on her CrCL <30 and treat total 7 days.  Hyperparathyroidism - high PTH. Likely 2/2 to CKD? See above.  Hypercalcemia - corrected was 11 now normal.  - could be from immobilization, hypernatremia? PTH is high, phos is normal, likely secondary hyperthyroidism.  Sick euthyroid syndrome - low tsh 0.168, normal T4 1.0   Macrocytosis - hemoglobin normal 12.9. Unclear what baseline is. - was 15.3 hgb initiailly, improved with IVF. - MCV 108. Folate and b12 normal. TSH low but T4 normal. Unclear of the cause of macrocytosis.  DNR - confirmed with her state appointed guardian Desire Rogelio Seen [phone] or [phone].  Heparin dvt ppx Dysphagia 3 diet.    Dispo: Disposition is deferred at this time, awaiting improvement of current medical problems.  Anticipated discharge in approximately 1-2 day(s).   The patient does have a current PCP (No primary care provider on file.) and does need an [facility] hospital follow-up appointment after discharge.  The patient does have transportation limitations that hinder transportation to clinic appointments.  .Services Needed at time of discharge: Y = Yes, Blank = No PT:   OT:   RN:   Equipment:   Other:     LOS: 5 days   Hyacinth Meeker, MD 11/11/2014, 1:08 PM

## 2014-11-11 NOTE — Progress Notes (Signed)
CSW (Clinical Child psychotherapistocial Worker) received call from MD asking if current facility can upgrade pt to SNF. Weekend CSW aware from weekday CSW handoff that facility confirmed they are able to provide level of care pt is requiring. MD notified of this information. CSW confirmed pt can dc back today if medically stable. Per MD, plan is for dc. CSW will arrange once dc summary available.  Hisayo Delossantos Lajean Savermbelal, LCSWA Weekend CSW [phone]

## 2014-11-11 NOTE — Progress Notes (Signed)
  Date: 11/11/2014  Patient name: Freda JacksonGrace E Belger  Medical record number: [id number]  Date of birth: 06/21/1926   This patient's plan of care was discussed with the house staff. Please see their note for complete details. I concur with their findings.  Na improved to 147 today. Patient is alert and answers questions but is not oriented.  She can likely return to her SNF today (she will need SNF based on PT notes), however, if she will need frequent assistance with fluid intake.  If this cannot be arranged today, she can be discharged tomorrow.     Inez CatalinaEmily B Criss Bartles, MD 11/11/2014, 11:55 AM

## 2014-11-13 LAB — CULTURE, BLOOD (ROUTINE X 2)
CULTURE: NO GROWTH
Culture: NO GROWTH

## 2016-04-25 IMAGING — MR MR HEAD W/O CM
8 of 10 series · 39 of 48 positions shown · non-contrast
Comparison: Several prior CTs, most recent 11/06/2014 and most
remote 09/01/2008.

CLINICAL DATA: 88-year-old female with hypercholesterolemia
presenting with increased confusion over the past week. Initial
encounter.

EXAM:
MRI HEAD WITHOUT CONTRAST
TECHNIQUE: Multiplanar, multiecho pulse sequences of the brain and surrounding
structures were obtained without intravenous contrast.

[Series 3: DWI · axial · 3.0mm · 1.09mm/px · z∈[-22,+118]mm · 11 of 96 slices shown (1 of 4)]
[im 1/96]
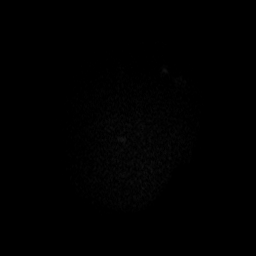
[im 10/96]
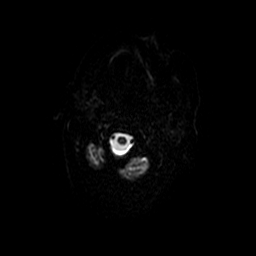
[im 20/96]
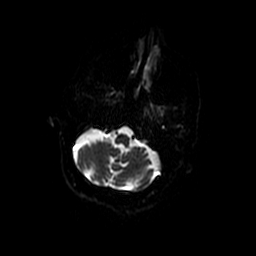
[im 29/96]
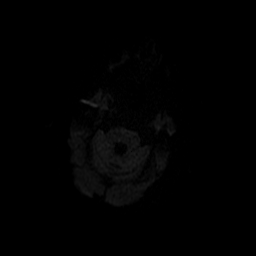
[im 39/96]
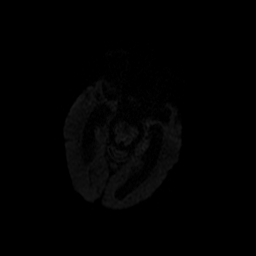
[im 48/96]
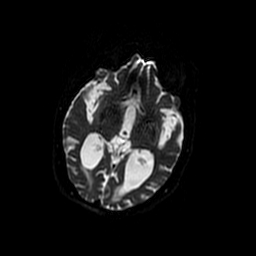
[im 58/96]
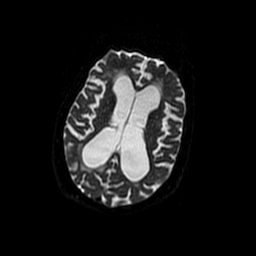
[im 67/96]
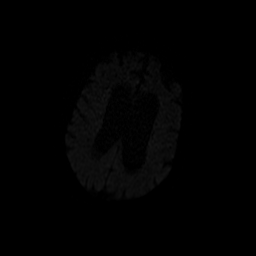
[im 77/96]
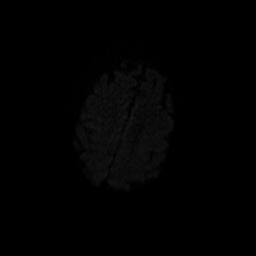
[im 86/96]
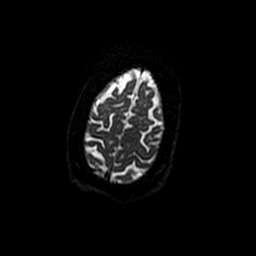
[im 96/96]
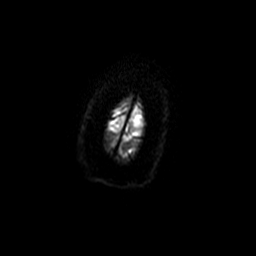

[Series 4: T2 · axial · 5.0mm · 0.43mm/px · 1 of 24 slices shown]
[im 1/24]
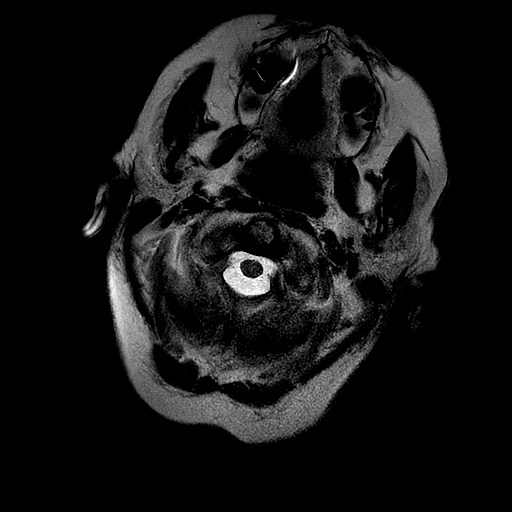

[Series 5: FLAIR · axial · 5.0mm · 0.43mm/px · z∈[-17,+119]mm · 3 of 24 slices shown (1 of 3)]
[im 1/24]
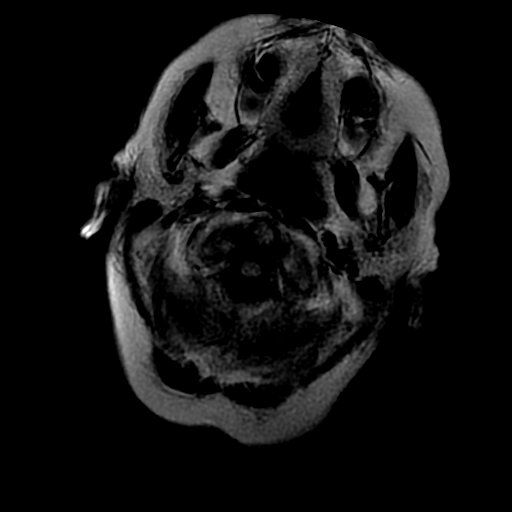
[im 12/24]
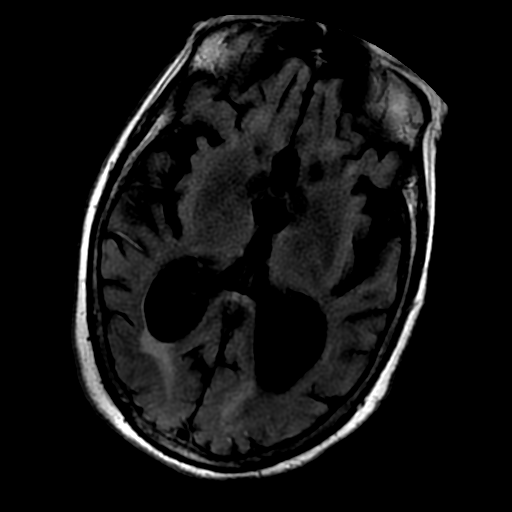
[im 24/24]
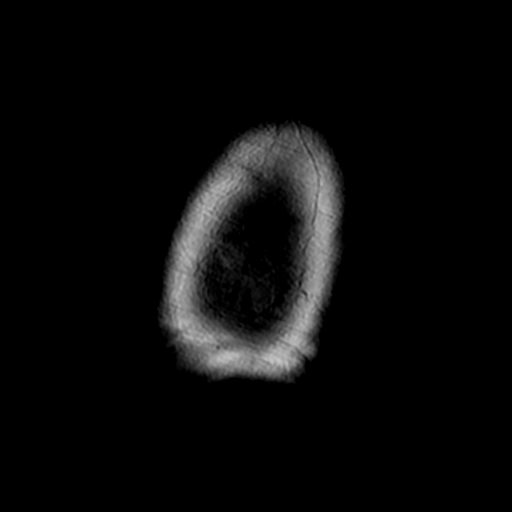

[Series 9: DWI · coronal · 5.0mm · 1.09mm/px · 8 of 66 slices shown (2 of 4)]
[im 1/66]
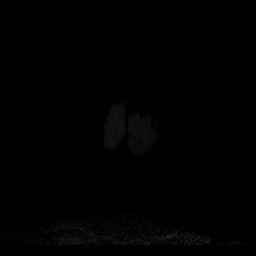
[im 10/66]
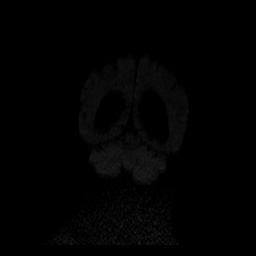
[im 19/66]
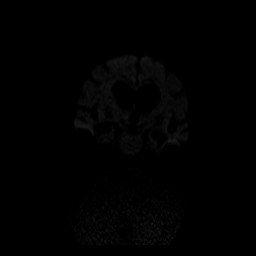
[im 28/66]
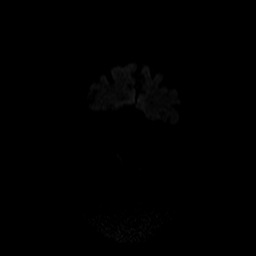
[im 38/66]
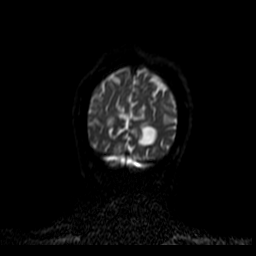
[im 47/66]
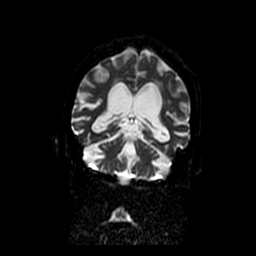
[im 56/66]
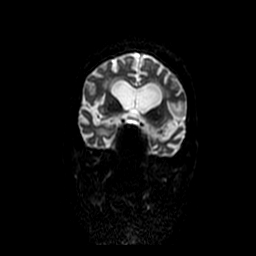
[im 66/66]
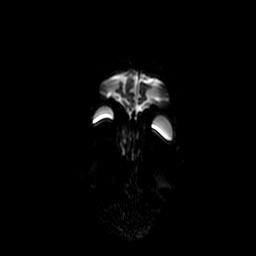

[Series 10: FLAIR · sagittal · 5.0mm · 0.47mm/px · 3 of 24 slices shown (2 of 3)]
[im 1/24]
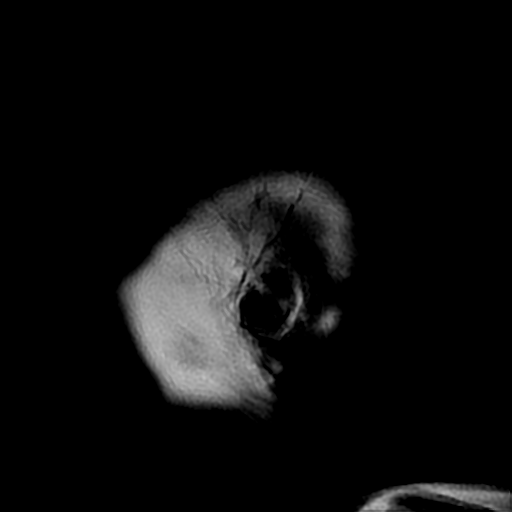
[im 12/24]
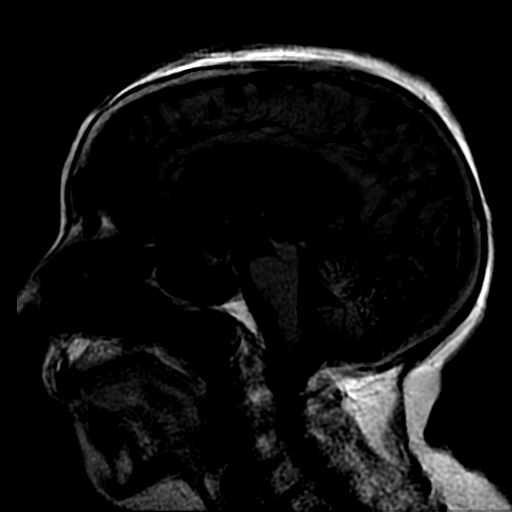
[im 24/24]
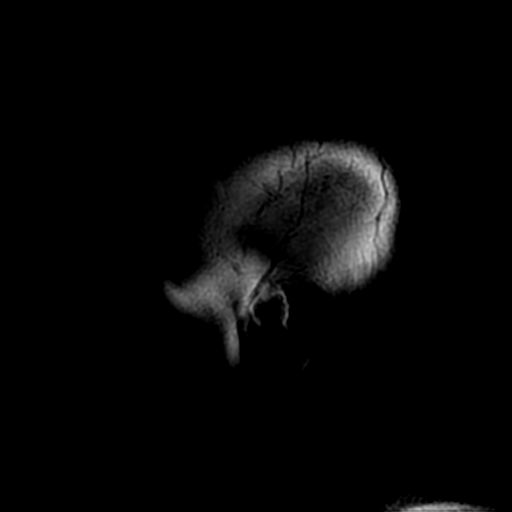

[Series 11: FLAIR · axial · 5.0mm · 0.43mm/px · z∈[-17,+119]mm · 3 of 24 slices shown (3 of 3)]
[im 1/24]
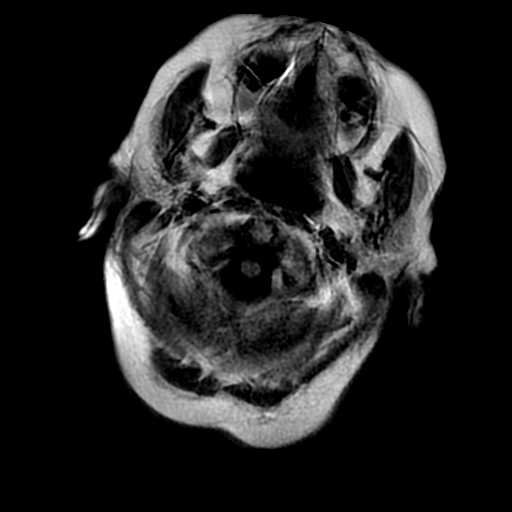
[im 12/24]
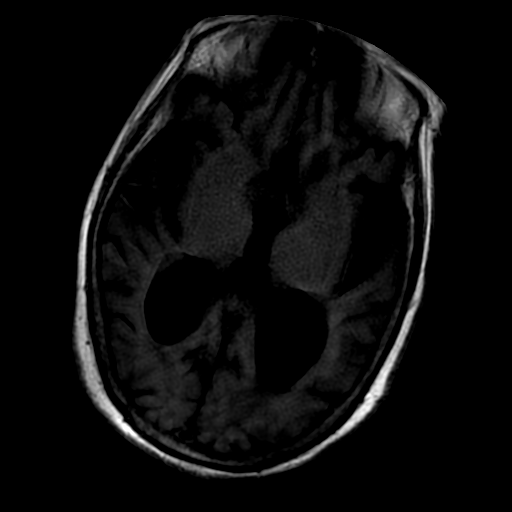
[im 24/24]
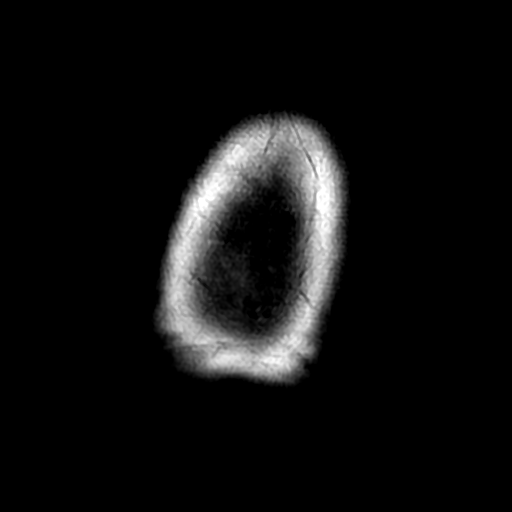

[Series 300: DWI · axial · 3.0mm · 1.09mm/px · z∈[-22,+118]mm · 6 of 48 slices shown (3 of 4)]
[im 1/48]
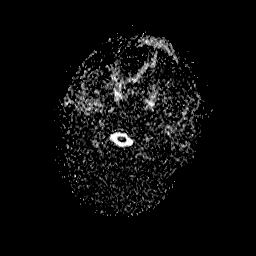
[im 10/48]
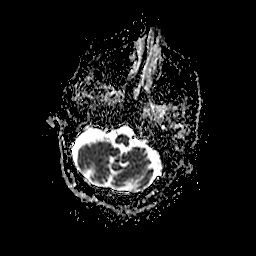
[im 19/48]
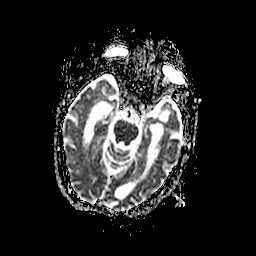
[im 29/48]
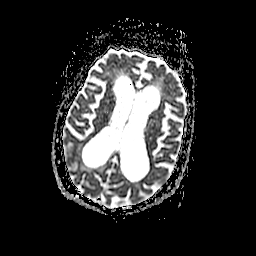
[im 38/48]
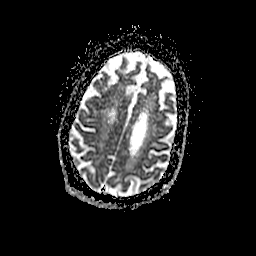
[im 48/48]
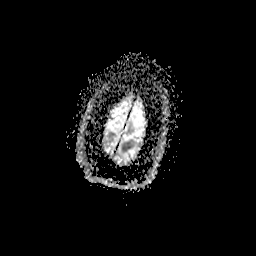

[Series 900: DWI · coronal · 5.0mm · 1.09mm/px · 4 of 33 slices shown (4 of 4)]
[im 1/33]
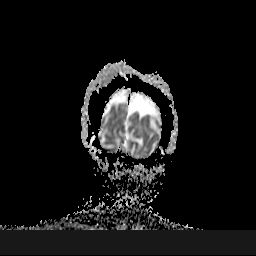
[im 11/33]
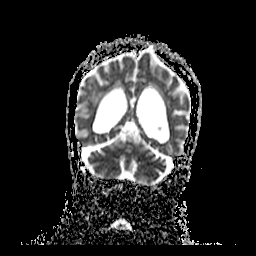
[im 22/33]
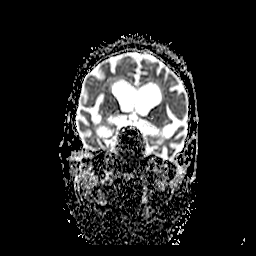
[im 33/33]
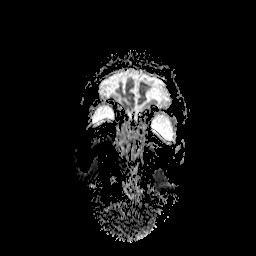

[39 of 48 positions shown; findings below may reference images not displayed]

FINDINGS: Exam is motion degraded.

No acute infarct.

No intracranial hemorrhage.

Moderate small vessel disease type changes.

Global prominent atrophy. Ventricular prominence may be related to
atrophy although difficult to completely exclude a component of
superimposed mild hydrocephalus. The degree of ventricular
prominence has progressed since most remote exam of 1888.

No intracranial mass lesion noted on this unenhanced exam.

Major intracranial vascular structures are patent.

Cervical medullary junction, pituitary region, pineal region and
orbital structures unremarkable.
IMPRESSION: Exam is motion degraded.

No acute infarct.

Moderate small vessel disease type changes.

Global prominent atrophy. Ventricular prominence may be related to
atrophy although difficult to completely exclude a component of
superimposed mild hydrocephalus. The degree of ventricular
prominence has progressed since most remote exam of [DATE].

## 2016-04-26 IMAGING — US US RENAL
1 series · 14 of 25 positions shown · non-contrast
Comparison: None.

CLINICAL DATA: Rising creatinine.  Renal disorder.

EXAM:
RENAL / URINARY TRACT ULTRASOUND COMPLETE

[Series 1: us renal · 0.20mm/px · 14 of 26 slices shown]
[im 1/26]
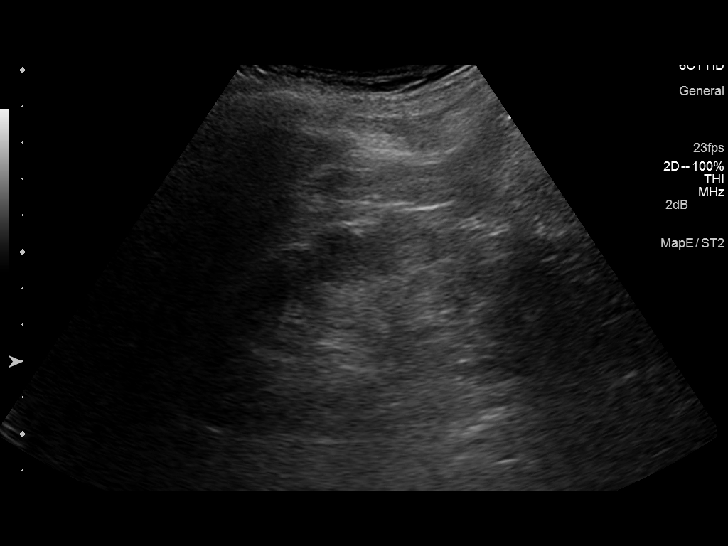
[im 3/26]
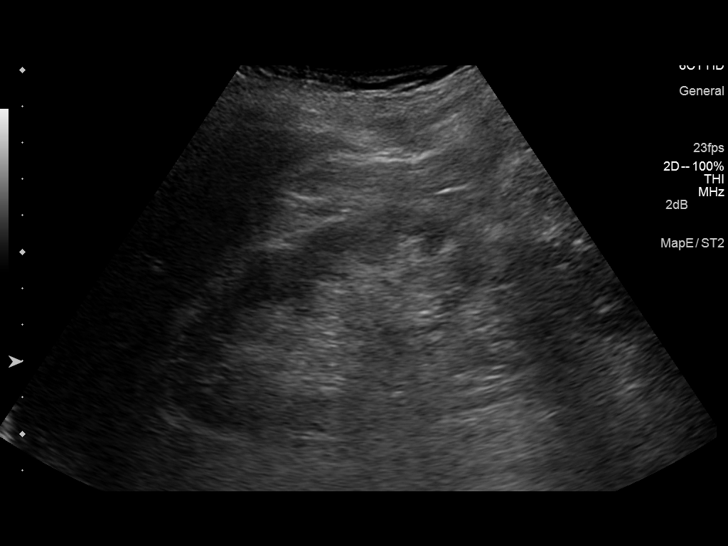
[im 5/26]
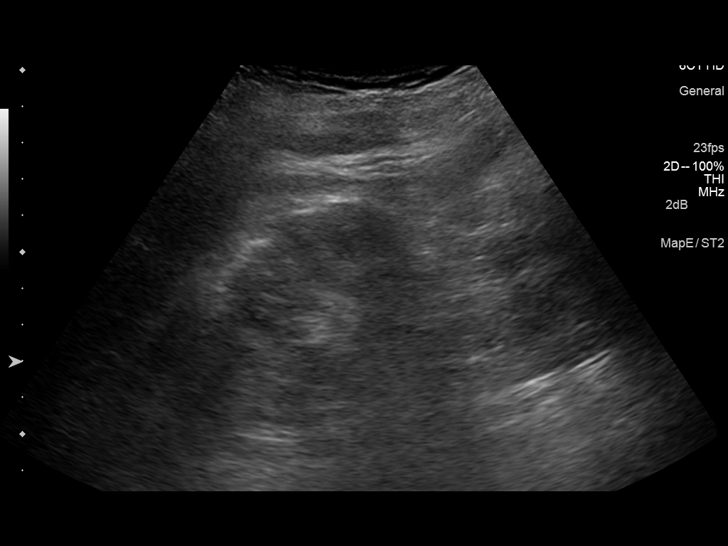
[im 7/26]
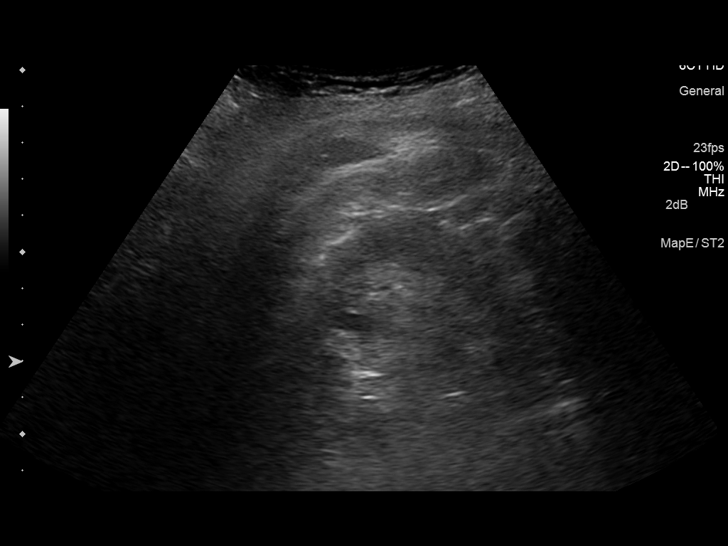
[im 9/26]
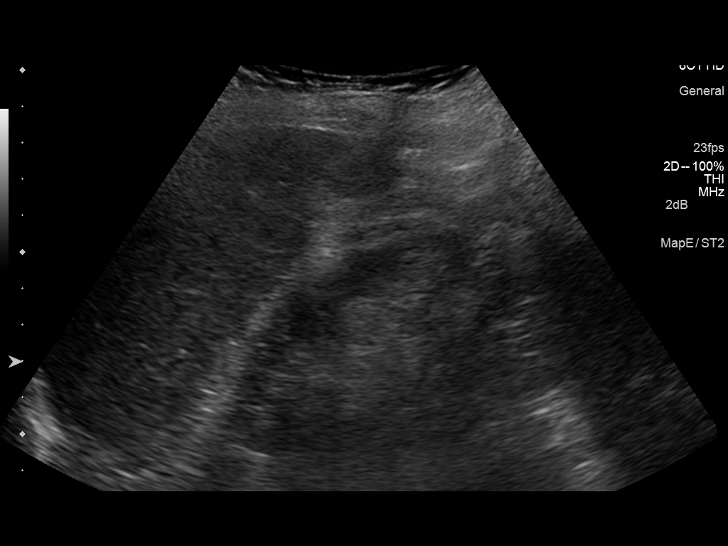
[im 10/26]
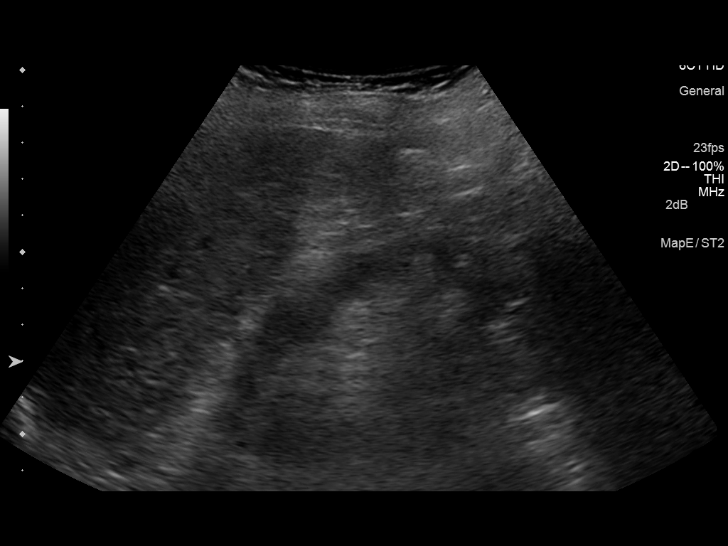
[im 12/26]
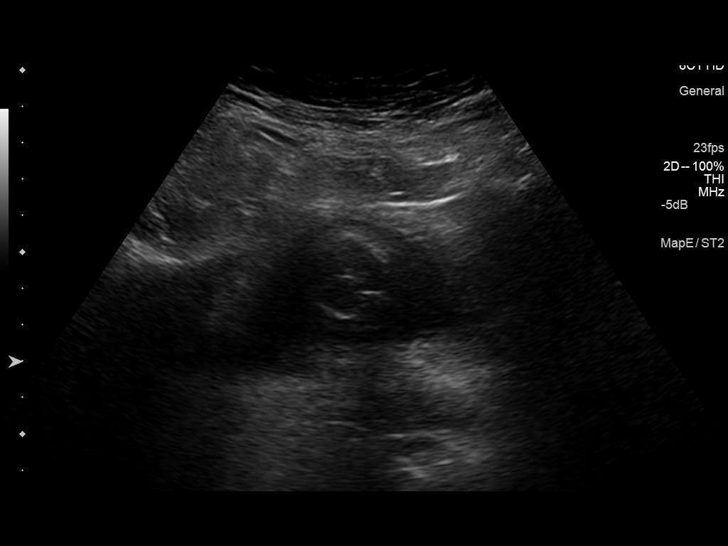
[im 14/26]
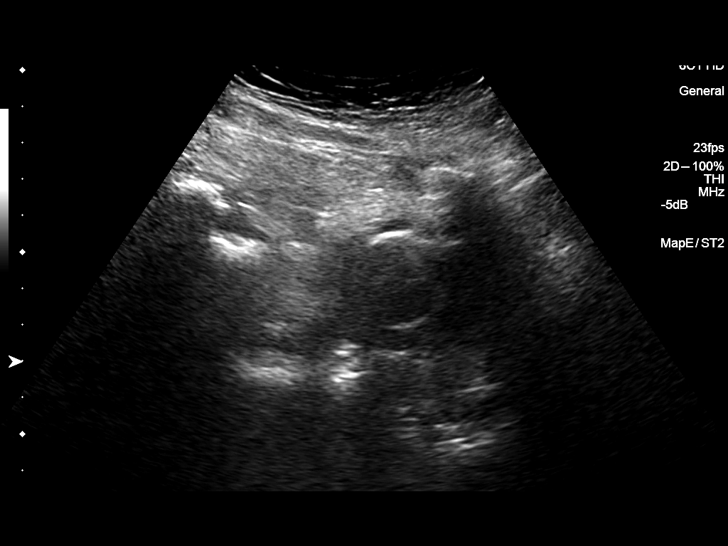
[im 16/26]
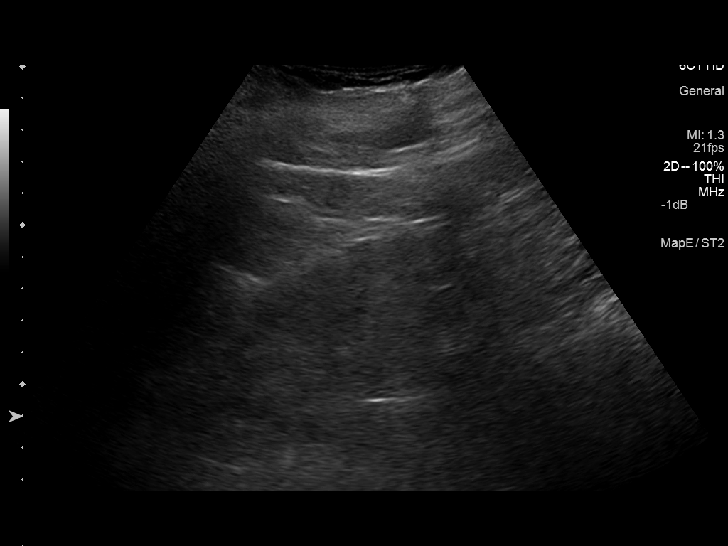
[im 17/26]
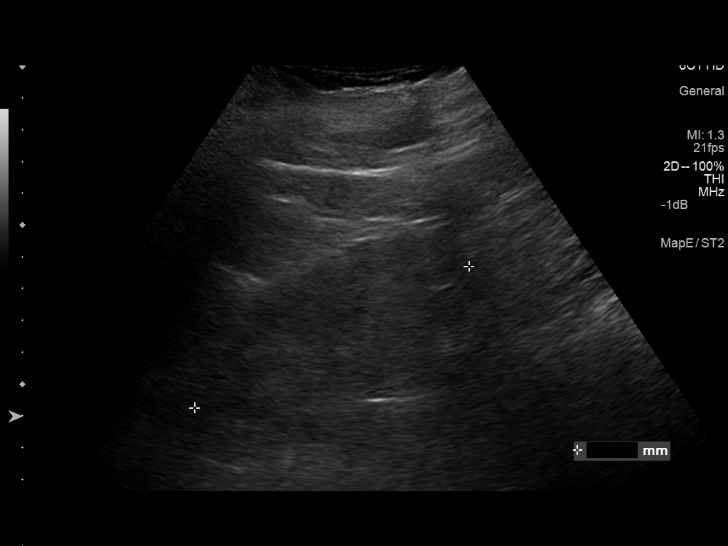
[im 19/26]
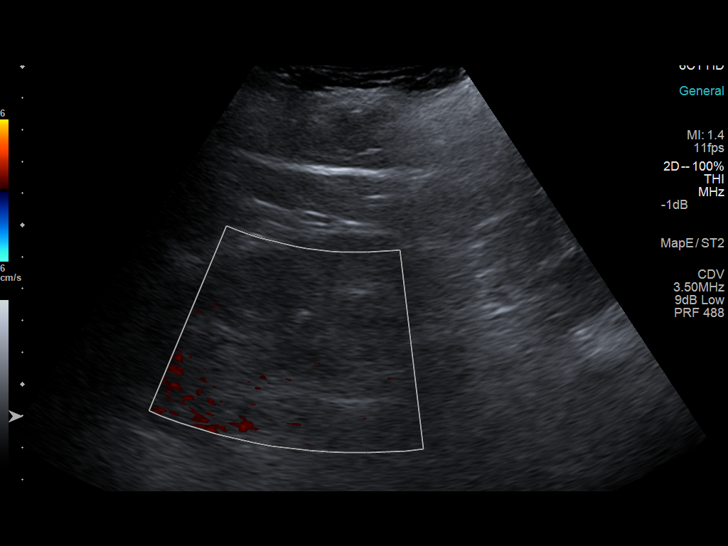
[im 21/26]
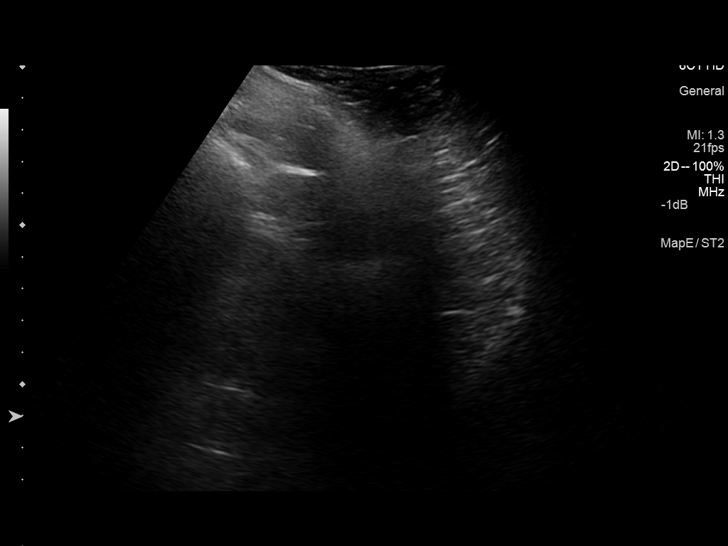
[im 23/26]
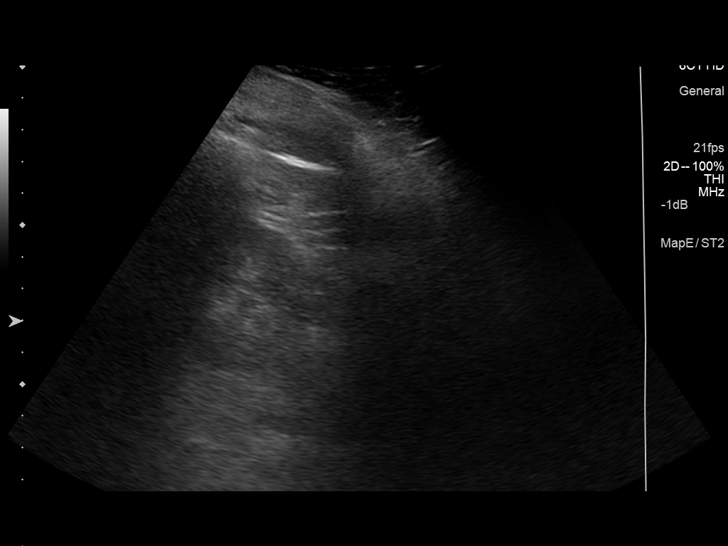
[im 26/26]
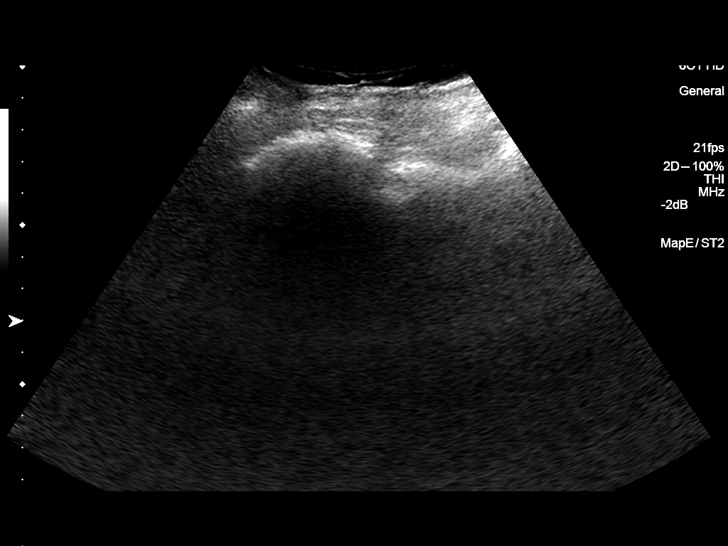

[14 of 25 positions shown; findings below may reference images not displayed]

FINDINGS: Right Kidney:

Length: 9.7 cm. Renal cortical thinning with increased renal
cortical echogenicity. No mass or hydronephrosis visualized.

Left Kidney:

Length: 9.7 cm. Renal cortical thinning with increased renal
cortical echogenicity. No mass or hydronephrosis visualized.

Bladder:

Decompressed bladder with a Foley catheter present.
IMPRESSION: 1. No obstructive uropathy.
2. Bilateral renal cortical thinning and increased renal cortical
echogenicity as can be seen with medical renal disease.

## 2016-04-29 IMAGING — US US ABDOMEN COMPLETE
1 series · 13 of 25 positions shown · non-contrast
Comparison: Renal ultrasound November 07, 2014

CLINICAL DATA: Abdominal pain

EXAM:
ULTRASOUND ABDOMEN COMPLETE

[Series 1: us abdomen complete · 0.11mm/px · 13 of 41 slices shown]
[im 1/41]
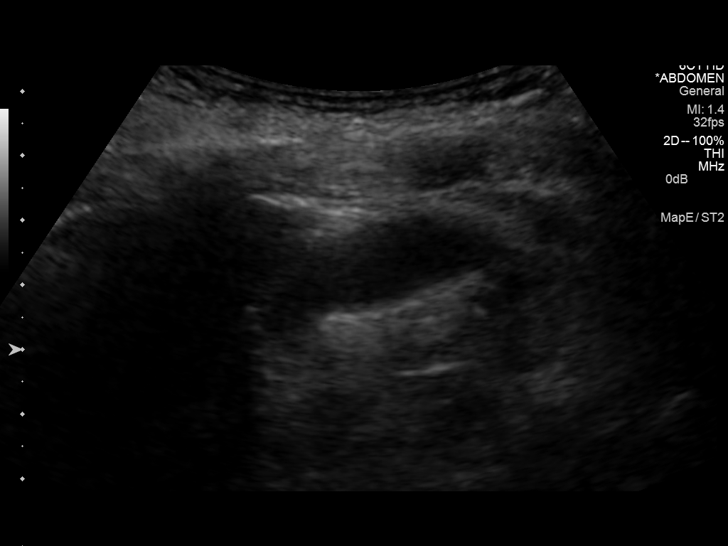
[im 4/41]
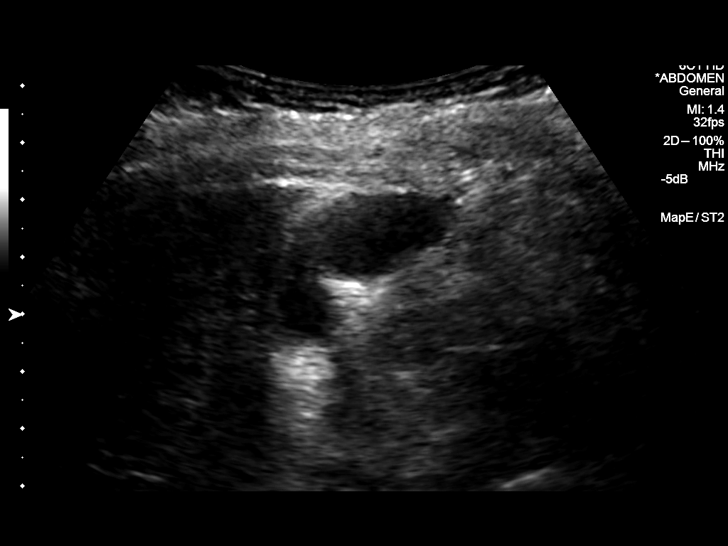
[im 7/41]
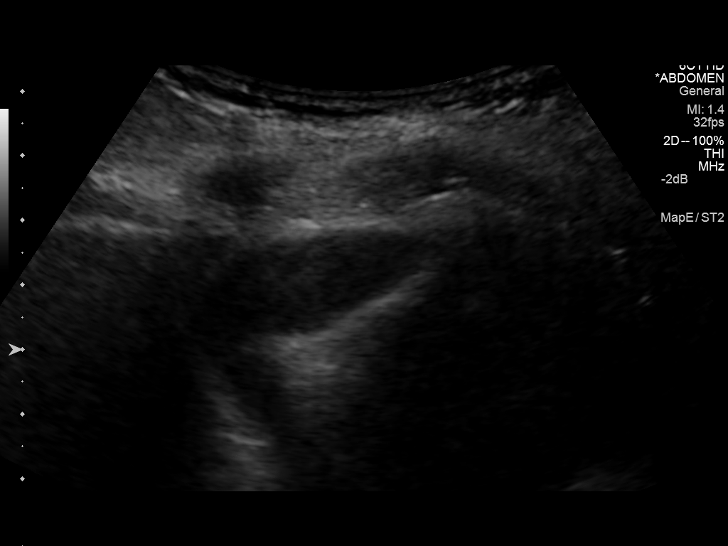
[im 11/41]
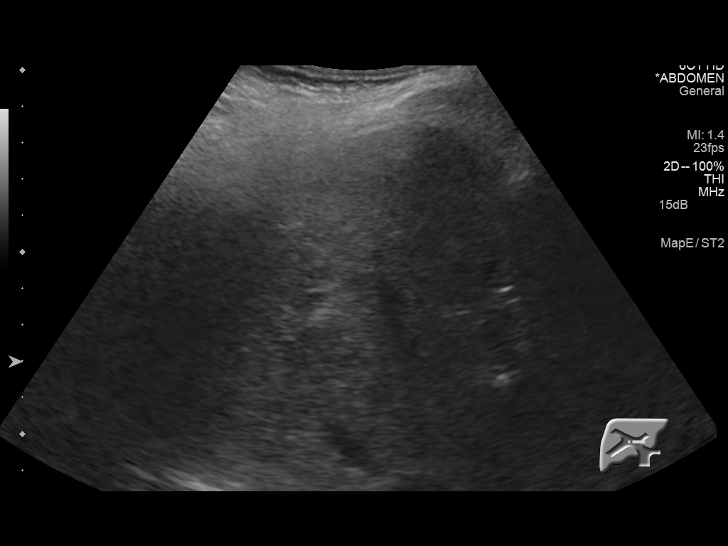
[im 14/41]
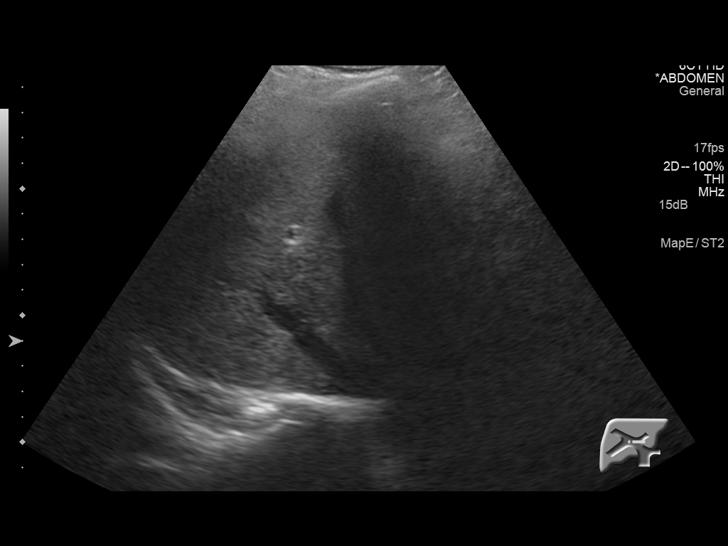
[im 17/41]
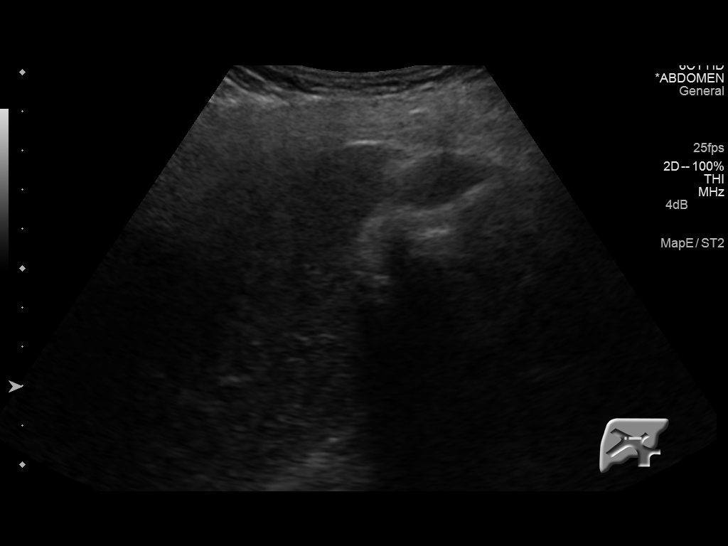
[im 21/41]
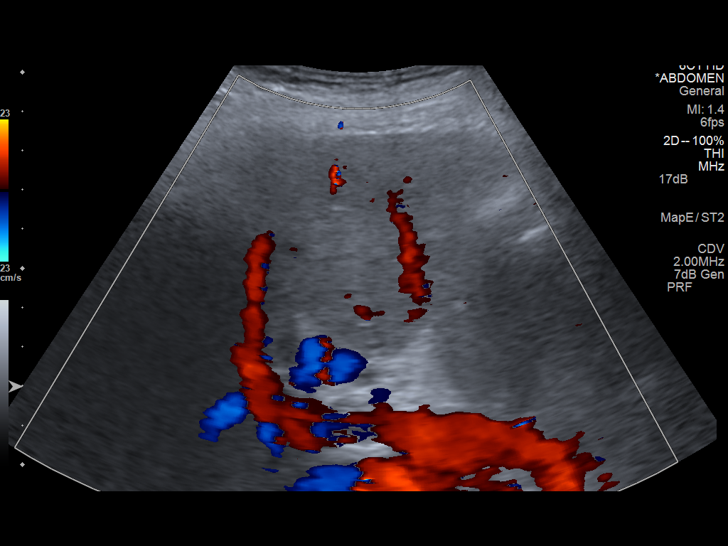
[im 24/41]
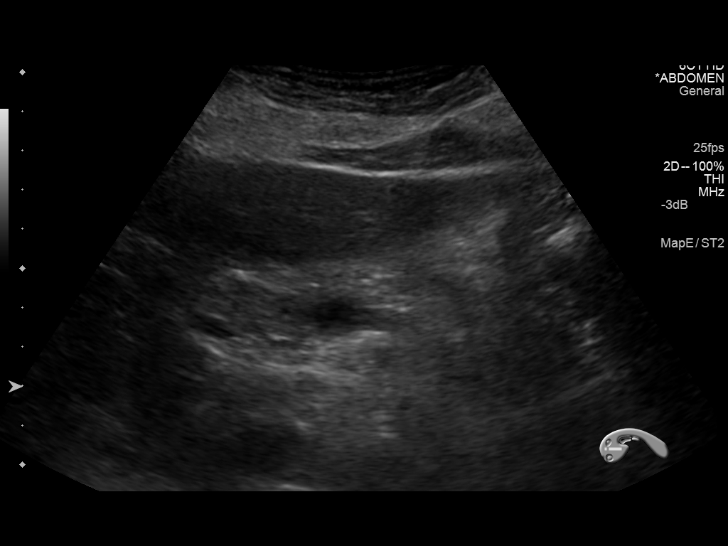
[im 27/41]
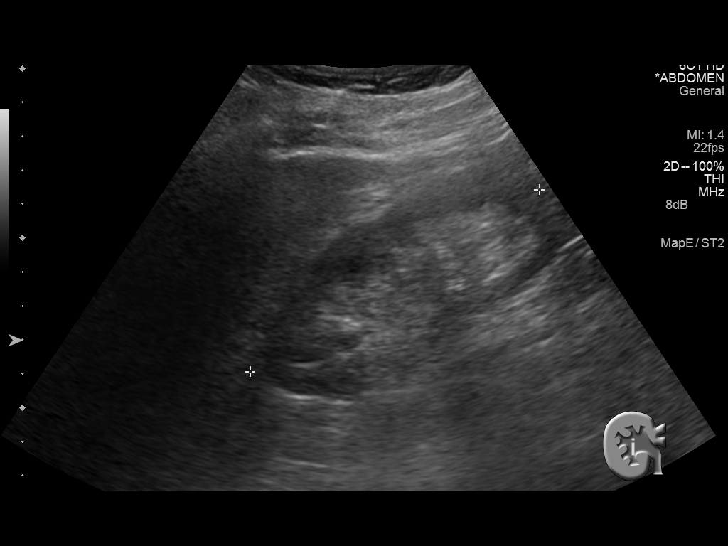
[im 31/41]
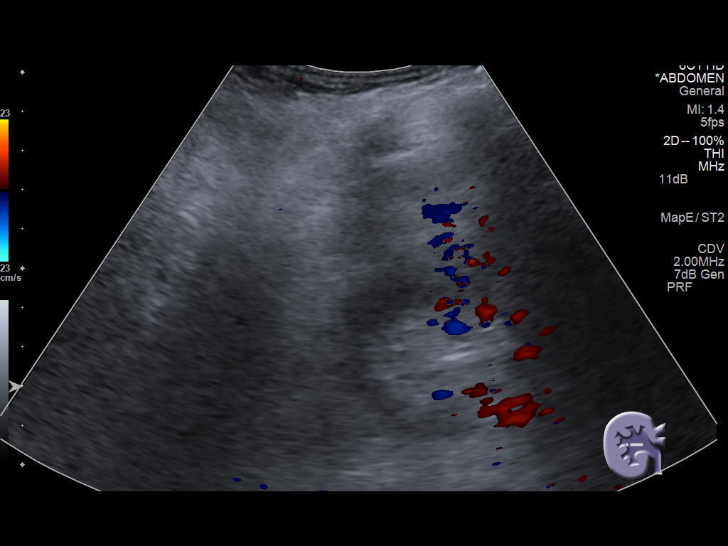
[im 34/41]
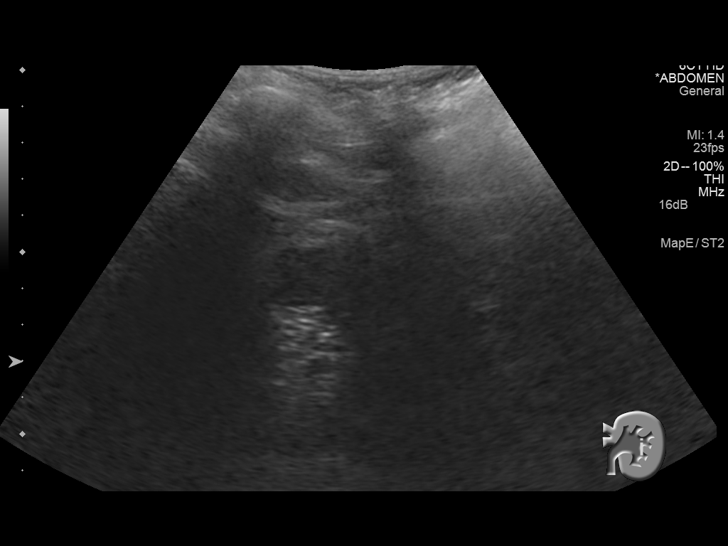
[im 37/41]
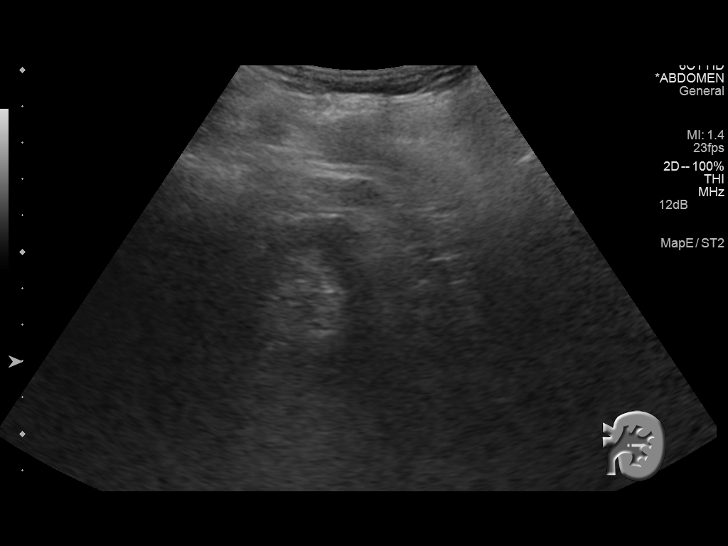
[im 41/41]
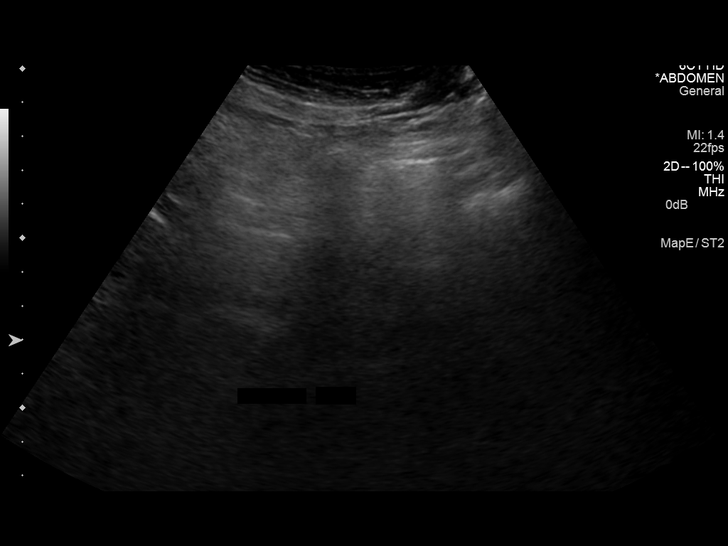

[13 of 25 positions shown; findings below may reference images not displayed]

FINDINGS: Gallbladder: No gallstones or wall thickening visualized. There is
no pericholecystic fluid. No sonographic Murphy sign noted.

Common bile duct: Diameter: 4 mm. There is no intrahepatic, common
hepatic, or common bile duct dilatation.

Liver: No focal lesion identified. Within normal limits in
parenchymal echogenicity.

IVC: No abnormality visualized.

Pancreas: Visualized portion unremarkable. Portions of the pancreas
are obscured by gas.

Spleen: Size and appearance within normal limits.

Right Kidney: Length: 10.1 cm. Echogenicity is slightly increased.
There is renal cortical thinning. No mass or hydronephrosis
visualized.

Left Kidney: Length: 10.1 cm. Echogenicity is slightly increased.
There is renal cortical thinning. No mass or hydronephrosis
visualized.

Abdominal aorta: No aneurysm visualized.

Other findings: No demonstrable ascites.
IMPRESSION: Kidneys show mildly increased echogenicity and renal cortical
thinning. Question medical renal disease. No renal obstruction or
identified on either side. Portions of the pancreas are obscured by
gas. Visualized portions of pancreas appear normal. Study otherwise
unremarkable.

## 2016-07-23 ENCOUNTER — Emergency Department
Admission: EM | Admit: 2016-07-23 | Discharge: 2016-07-23 | Disposition: A | Discharge status: Home / Self Care | Payer: Medicare Other | Attending: Student in an Organized Health Care Education/Training Program | Admitting: Student in an Organized Health Care Education/Training Program

## 2016-07-23 ENCOUNTER — Emergency Department: Payer: Medicare Other

## 2016-07-23 ENCOUNTER — Encounter: Payer: Self-pay | Admitting: *Deleted

## 2016-07-23 DIAGNOSIS — N3000 Acute cystitis without hematuria: Secondary | ICD-10-CM | POA: Diagnosis not present

## 2016-07-23 DIAGNOSIS — E860 Dehydration: Secondary | ICD-10-CM | POA: Diagnosis present

## 2016-07-23 DIAGNOSIS — F0390 Unspecified dementia without behavioral disturbance: Secondary | ICD-10-CM | POA: Insufficient documentation

## 2016-07-23 DIAGNOSIS — Z87891 Personal history of nicotine dependence: Secondary | ICD-10-CM | POA: Diagnosis not present

## 2016-07-23 LAB — COMPREHENSIVE METABOLIC PANEL
ALT: 12 U/L — ABNORMAL LOW (ref 14–54)
ANION GAP: 7 (ref 5–15)
AST: 30 U/L (ref 15–41)
Albumin: 3.5 g/dL (ref 3.5–5.0)
Alkaline Phosphatase: 80 U/L (ref 38–126)
BUN: 25 mg/dL — ABNORMAL HIGH (ref 6–20)
CHLORIDE: 103 mmol/L (ref 101–111)
CO2: 31 mmol/L (ref 22–32)
Calcium: 9.8 mg/dL (ref 8.9–10.3)
Creatinine, Ser: 1.37 mg/dL — ABNORMAL HIGH (ref 0.44–1.00)
GFR calc non Af Amer: 33 mL/min — ABNORMAL LOW (ref >60)
GFR, EST AFRICAN AMERICAN: 39 mL/min — AB (ref >60)
Glucose, Bld: 114 mg/dL — ABNORMAL HIGH (ref 65–99)
Potassium: 4.6 mmol/L (ref 3.5–5.1)
SODIUM: 141 mmol/L (ref 135–145)
Total Bilirubin: 0.4 mg/dL (ref 0.3–1.2)
Total Protein: 6.9 g/dL (ref 6.5–8.1)

## 2016-07-23 LAB — URINALYSIS, COMPLETE (UACMP) WITH MICROSCOPIC
BILIRUBIN URINE: NEGATIVE
Bacteria, UA: NONE SEEN
Glucose, UA: NEGATIVE mg/dL
KETONES UR: NEGATIVE mg/dL
Nitrite: NEGATIVE
Protein, ur: 30 mg/dL — AB
SPECIFIC GRAVITY, URINE: 1.012 (ref 1.005–1.030)
pH: 6 (ref 5.0–8.0)

## 2016-07-23 LAB — CBC
HCT: 37.4 % (ref 35.0–47.0)
Hemoglobin: 12.8 g/dL (ref 12.0–16.0)
MCH: 33.8 pg (ref 26.0–34.0)
MCHC: 34.1 g/dL (ref 32.0–36.0)
MCV: 99 fL (ref 80.0–100.0)
PLATELETS: 209 10*3/uL (ref 150–440)
RBC: 3.78 MIL/uL — ABNORMAL LOW (ref 3.80–5.20)
RDW: 13.8 % (ref 11.5–14.5)
WBC: 6.5 10*3/uL (ref 3.6–11.0)

## 2016-07-23 MED ORDER — CEFTRIAXONE SODIUM-DEXTROSE 1-3.74 GM-% IV SOLR
1.0000 g | Freq: Once | INTRAVENOUS | Status: AC
  Administered 2016-07-23: 1 g via INTRAVENOUS
  Filled 2016-07-23: qty 50

## 2016-07-23 MED ORDER — SODIUM CHLORIDE 0.9 % IV BOLUS (SEPSIS)
500.0000 mL | Freq: Once | INTRAVENOUS | Status: AC
  Administered 2016-07-23: 500 mL via INTRAVENOUS

## 2016-07-23 MED ORDER — DEXTROSE 5 % IV SOLN: 1.0000 g | Freq: Once | INTRAVENOUS | Status: DC

## 2016-07-23 MED ORDER — CEPHALEXIN 250 MG PO CAPS
250.0000 mg | ORAL_CAPSULE | Freq: Three times a day (TID) | ORAL | 0 refills | Status: AC
Start: 2016-07-23 — End: 2016-07-30

## 2016-07-23 NOTE — ED Provider Notes (Signed)
[facility] Emergency Department Provider Note    First MD Initiated Contact with Patient 07/23/16 1515     (approximate)  I have reviewed the triage vital signs and the nursing notes.   HISTORY  Chief Complaint Dehydration  Level V Caveat:  dementia  HPI Molly Jimenez is a 83 y.o. female visiting from [facility] assisted living with concern for dehydration. Patient has not been eating or drinking her normal for the past 2 days. EMS was called due to concern for dehydration. EMS was on impression that they had run some tests and tried to hydrate the patient at the facility. I called the facility and they saw that she was drooping over and acting different from baseline so they sent her to the ER. She is a DO NOT RESUSCITATE. No family contacts. They're uncertain of her goals of care. No fevers. Vital signs and stable. No nausea or vomiting. No diarrhea.   Past Medical History:  Diagnosis Date  . GERD (gastroesophageal reflux disease)   . Hypercholesteremia   . Renal disorder    History reviewed. No pertinent family history. History reviewed. No pertinent surgical history. Patient Active Problem List   Diagnosis Date Noted  . Secondary hyperparathyroidism of renal origin (HCC) 11/10/2014  . Bradycardia   . Diastolic dysfunction 11/08/2014  . Subclinical hyperthyroidism 11/08/2014  . Polyclonal gammopathy determined by serum protein electrophoresis 11/08/2014  . Metabolic encephalopathy   . Hypernatremia 11/06/2014  . Acute renal failure superimposed on stage 3 chronic kidney disease (HCC) 11/06/2014  . UTI (urinary tract infection) 11/06/2014  . Atrial fibrillation with rapid ventricular response (HCC) 11/06/2014  . Hypercalcemia 11/06/2014  . Osteopenia 11/06/2014  . Prediabetes 11/06/2014  . GERD (gastroesophageal reflux disease) 11/06/2014  . Allergic rhinitis 11/06/2014      Prior to Admission medications   Medication Sig  Start Date End Date Taking? Authorizing Provider  acetaminophen (TYLENOL) 500 MG tablet Take 500 mg by mouth 3 (three) times daily as needed (pain).    Historical Provider, MD  aspirin 81 MG chewable tablet Chew 81 mg by mouth every morning.    Historical Provider, MD  atorvastatin (LIPITOR) 10 MG tablet Take 10 mg by mouth every morning.    Historical Provider, MD  betamethasone dipropionate (DIPROLENE) 0.05 % cream See admin instructions. Apply a thin layer topically to areas of redness/scaling on ankle, knees, back of ands, and low back twice daily as needed for redness/scaling    Historical Provider, MD  Calcium Carbonate-Vitamin D 600-400 MG-UNIT per tablet Take 1 tablet by mouth 2 (two) times daily.    Historical Provider, MD  cephALEXin (KEFLEX) 250 MG capsule Take 1 capsule (250 mg total) by mouth 3 (three) times daily. 07/23/16 07/30/16  Willy Eddy, MD  citalopram (CELEXA) 20 MG tablet Take 20 mg by mouth every morning.    Historical Provider, MD  feeding supplement (BOOST HIGH PROTEIN) LIQD Take 1 Container by mouth every morning.    Historical Provider, MD  ketoconazole (NIZORAL) 2 % shampoo See admin instructions. Use as directed three times weekly (Monday, Wednesday, and Friday) with shower    Historical Provider, MD  loratadine (CLARITIN) 10 MG tablet Take 10 mg by mouth daily as needed for allergies.    Historical Provider, MD  memantine (NAMENDA) 10 MG tablet Take 10 mg by mouth 2 (two) times daily.    Historical Provider, MD  Multiple Vitamins-Minerals (CENTRUM SILVER PO) Take 1 tablet by mouth every morning.  Historical Provider, MD  omeprazole (PRILOSEC) 20 MG capsule Take 20 mg by mouth daily as needed (heartburn).    Historical Provider, MD  OVER THE COUNTER MEDICATION Take 4 oz by mouth every 2 (two) hours. Encourage 4oz fluids    Historical Provider, MD  polyethylene glycol (MIRALAX / GLYCOLAX) packet Take 17 g by mouth every morning.    Historical Provider, MD    triamcinolone cream (KENALOG) 0.1 % See admin instructions. Apply to affected areas of rash at bedtime    Historical Provider, MD    Allergies Patient has no known allergies.    Social History Social History  Substance Use Topics  . Smoking status: Former Games developermoker  . Smokeless tobacco: Never Used  . Alcohol use No    Review of Systems Patient denies headaches, rhinorrhea, blurry vision, numbness, shortness of breath, chest pain, edema, cough, abdominal pain, nausea, vomiting, diarrhea, dysuria, fevers, rashes or hallucinations unless otherwise stated above in HPI. ____________________________________________   PHYSICAL EXAM:  VITAL SIGNS: Vitals:   07/23/16 1628 07/23/16 1828  BP: 111/73 131/81  Pulse: 67 83  Resp: 18 18  Temp: 97.8 F (36.6 C)     Constitutional: Alert elderly, demented, no acute distress Eyes: Conjunctivae are normal. PERRL. EOMI. Head: Atraumatic. Nose: No congestion/rhinnorhea. Mouth/Throat: Mucous membranes are moist.  Oropharynx non-erythematous. Neck: No stridor. Painless ROM. No cervical spine tenderness to palpation Hematological/Lymphatic/Immunilogical: No cervical lymphadenopathy. Cardiovascular: Normal rate, regular rhythm. Grossly normal heart sounds.  Good peripheral circulation. Respiratory: Normal respiratory effort.  No retractions. Lungs CTAB. Gastrointestinal: Soft and nontender. No distention. No abdominal bruits. No CVA tenderness. Musculoskeletal: No lower extremity tenderness nor edema.  No joint effusions. Neurologic: Patient of very hard of hearing. Able to answer yes or no questions. According to a facility and EMS patient currently at her baseline. She does not have any facial droop. No lateralizing weakness.  Skin:  Skin is warm, dry and intact. No rash noted.   ____________________________________________   LABS (all labs ordered are listed, but only abnormal results are displayed)  Results for orders placed or  performed during the hospital encounter of 07/23/16 (from the past 24 hour(s))  CBC     Status: Abnormal   Collection Time: 07/23/16  3:19 PM  Result Value Ref Range   WBC 6.5 3.6 - 11.0 K/uL   RBC 3.78 (L) 3.80 - 5.20 MIL/uL   Hemoglobin 12.8 12.0 - 16.0 g/dL   HCT 14.737.4 82.935.0 - 56.247.0 %   MCV 99.0 80.0 - 100.0 fL   MCH 33.8 26.0 - 34.0 pg   MCHC 34.1 32.0 - 36.0 g/dL   RDW 13.013.8 86.511.5 - 78.414.5 %   Platelets 209 150 - 440 K/uL  Comprehensive metabolic panel     Status: Abnormal   Collection Time: 07/23/16  3:19 PM  Result Value Ref Range   Sodium 141 135 - 145 mmol/L   Potassium 4.6 3.5 - 5.1 mmol/L   Chloride 103 101 - 111 mmol/L   CO2 31 22 - 32 mmol/L   Glucose, Bld 114 (H) 65 - 99 mg/dL   BUN 25 (H) 6 - 20 mg/dL   Creatinine, Ser 6.961.37 (H) 0.44 - 1.00 mg/dL   Calcium 9.8 8.9 - 29.510.3 mg/dL   Total Protein 6.9 6.5 - 8.1 g/dL   Albumin 3.5 3.5 - 5.0 g/dL   AST 30 15 - 41 U/L   ALT 12 (L) 14 - 54 U/L   Alkaline Phosphatase 80 38 - 126 U/L  Total Bilirubin 0.4 0.3 - 1.2 mg/dL   GFR calc non Af Amer 33 (L) >60 mL/min   GFR calc Af Amer 39 (L) >60 mL/min   Anion gap 7 5 - 15  Urinalysis, Complete w Microscopic     Status: Abnormal   Collection Time: 07/23/16  4:41 PM  Result Value Ref Range   Color, Urine YELLOW (A) YELLOW   APPearance CLOUDY (A) CLEAR   Specific Gravity, Urine 1.012 1.005 - 1.030   pH 6.0 5.0 - 8.0   Glucose, UA NEGATIVE NEGATIVE mg/dL   Hgb urine dipstick SMALL (A) NEGATIVE   Bilirubin Urine NEGATIVE NEGATIVE   Ketones, ur NEGATIVE NEGATIVE mg/dL   Protein, ur 30 (A) NEGATIVE mg/dL   Nitrite NEGATIVE NEGATIVE   Leukocytes, UA LARGE (A) NEGATIVE   RBC / HPF 6-30 0 - 5 RBC/hpf   WBC, UA TOO NUMEROUS TO COUNT 0 - 5 WBC/hpf   Bacteria, UA NONE SEEN NONE SEEN   Squamous Epithelial / LPF 0-5 (A) NONE SEEN   WBC Clumps PRESENT    Mucous PRESENT    ____________________________________________  EKG____________________________________________  RADIOLOGY  I  personally reviewed all radiographic images ordered to evaluate for the above acute complaints and reviewed radiology reports and findings.  These findings were personally discussed with the patient.  Please see medical record for radiology report.  ____________________________________________   PROCEDURES  Procedure(s) performed:  Procedures    Critical Care performed: no ____________________________________________   INITIAL IMPRESSION / ASSESSMENT AND PLAN / ED COURSE  Pertinent labs & imaging results that were available during my care of the patient were reviewed by me and considered in my medical decision making (see chart for details).  DDX: Dehydration, sepsis, pna, uti, hypoglycemia, cva, drug effect, withdrawal, encephalitis   Molly Jimenez is a 83 y.o. who presents to the ED with concern for dehydration from assisted living facility. Patient poorly not eating or drinking for several days. Patient does not appear to be in any acute distress. Does not appear clinically dehydrated. We'll check basic labs. Will order CT imaging of the head to evaluate for any evidence of acute intracranial abnormality.  The patient will be placed on continuous pulse oximetry and telemetry for monitoring.  Laboratory evaluation will be sent to evaluate for the above complaints.     Clinical Course    ----------------------------------------- 5:31 PM on 07/23/2016 -----------------------------------------  Urinalysis does show just a UTI. Patient has received IV fluids. She is tolerating oral hydration. Will get filled as of IV Rocephin to assure appropriate therapy.  Have discussed with the patient and available family all diagnostics and treatments performed thus far and all questions were answered to the best of my ability. The patient demonstrates understanding and agreement with plan.   ____________________________________________   FINAL CLINICAL IMPRESSION(S) / ED  DIAGNOSES  Final diagnoses:  Dehydration  Acute cystitis without hematuria      NEW MEDICATIONS STARTED DURING THIS VISIT:  Discharge Medication List as of 07/23/2016  5:29 PM    START taking these medications   Details  cephALEXin (KEFLEX) 250 MG capsule Take 1 capsule (250 mg total) by mouth 3 (three) times daily., Starting Wed 07/23/2016, Until Wed 07/30/2016, Print         Note:  This document was prepared using Dragon voice recognition software and may include unintentional dictation errors.    Willy Eddy, MD 07/24/16 [phone]

## 2016-07-23 NOTE — ED Triage Notes (Signed)
Pt sent from [facility] after PCP reported to pt that her lab work was abnormal and pt needed to come to ED for dehydration. Pt has hx of dementia and is reported to be at baseline. Vitals WNL. Pt in NAD at this time.

## 2016-07-23 NOTE — ED Notes (Signed)
Patient transported to CT and radiology 

## 2016-07-23 NOTE — ED Notes (Signed)
Patient left via [facility]. 

## 2016-07-23 NOTE — ED Notes (Signed)
Patient is still waiting for transport. NAD.

## 2016-07-23 NOTE — ED Notes (Signed)
Waiting for transport

## 2016-07-23 NOTE — ED Notes (Signed)
Rocephin infused.

## 2016-07-23 NOTE — ED Notes (Signed)
In and out cath for cloudy yellow urine.  Pt tolerated fair.  Did try to grab us, but calmed easily after procedure.

## 2017-02-09 ENCOUNTER — Emergency Department: Payer: Medicare Other

## 2017-02-09 ENCOUNTER — Emergency Department
Admission: EM | Admit: 2017-02-09 | Discharge: 2017-02-09 | Disposition: A | Discharge status: Home / Self Care | Payer: Medicare Other | Attending: Emergency Medicine | Admitting: Emergency Medicine

## 2017-02-09 ENCOUNTER — Encounter: Payer: Self-pay | Admitting: Emergency Medicine

## 2017-02-09 DIAGNOSIS — Y998 Other external cause status: Secondary | ICD-10-CM | POA: Insufficient documentation

## 2017-02-09 DIAGNOSIS — Z23 Encounter for immunization: Secondary | ICD-10-CM | POA: Insufficient documentation

## 2017-02-09 DIAGNOSIS — Z87891 Personal history of nicotine dependence: Secondary | ICD-10-CM | POA: Diagnosis not present

## 2017-02-09 DIAGNOSIS — Y92122 Bedroom in nursing home as the place of occurrence of the external cause: Secondary | ICD-10-CM | POA: Diagnosis not present

## 2017-02-09 DIAGNOSIS — S0990XA Unspecified injury of head, initial encounter: Secondary | ICD-10-CM | POA: Diagnosis present

## 2017-02-09 DIAGNOSIS — S0083XA Contusion of other part of head, initial encounter: Secondary | ICD-10-CM | POA: Insufficient documentation

## 2017-02-09 DIAGNOSIS — Y9384 Activity, sleeping: Secondary | ICD-10-CM | POA: Insufficient documentation

## 2017-02-09 DIAGNOSIS — Z7982 Long term (current) use of aspirin: Secondary | ICD-10-CM | POA: Insufficient documentation

## 2017-02-09 DIAGNOSIS — Z79899 Other long term (current) drug therapy: Secondary | ICD-10-CM | POA: Diagnosis not present

## 2017-02-09 DIAGNOSIS — W19XXXA Unspecified fall, initial encounter: Secondary | ICD-10-CM

## 2017-02-09 DIAGNOSIS — N183 Chronic kidney disease, stage 3 (moderate): Secondary | ICD-10-CM | POA: Diagnosis not present

## 2017-02-09 DIAGNOSIS — W06XXXA Fall from bed, initial encounter: Secondary | ICD-10-CM | POA: Insufficient documentation

## 2017-02-09 DIAGNOSIS — I480 Paroxysmal atrial fibrillation: Secondary | ICD-10-CM | POA: Insufficient documentation

## 2017-02-09 MED ORDER — TETANUS-DIPHTH-ACELL PERTUSSIS 5-2.5-18.5 LF-MCG/0.5 IM SUSP
0.5000 mL | Freq: Once | INTRAMUSCULAR | Status: AC
  Administered 2017-02-09: 0.5 mL via INTRAMUSCULAR
  Filled 2017-02-09: qty 0.5

## 2017-02-09 NOTE — ED Provider Notes (Signed)
[facility] Emergency Department Provider Note  ____________________________________________   First MD Initiated Contact with Patient 02/09/17 [phone]     (approximate)  I have reviewed the triage vital signs and the nursing notes.   HISTORY  Chief Complaint Fall  Level V exemption history Limited by the patient's dementia  HPI Molly Jimenez is a 83 y.o. female who comes to the emergency department via EMS with right sided forehead trauma. She lives at a nursing home and per her report when staff came to check on her this morning she was lying on the floor next to her bed. The patient states she thinks she rolled off the bed this morning. She does report mild headache.She does not take any blood thinning medication.   Past Medical History:  Diagnosis Date  . GERD (gastroesophageal reflux disease)   . Hypercholesteremia   . Renal disorder     Patient Active Problem List   Diagnosis Date Noted  . Secondary hyperparathyroidism of renal origin (HCC) 11/10/2014  . Bradycardia   . Diastolic dysfunction 11/08/2014  . Subclinical hyperthyroidism 11/08/2014  . Polyclonal gammopathy determined by serum protein electrophoresis 11/08/2014  . Metabolic encephalopathy   . Hypernatremia 11/06/2014  . Acute renal failure superimposed on stage 3 chronic kidney disease (HCC) 11/06/2014  . UTI (urinary tract infection) 11/06/2014  . Atrial fibrillation with rapid ventricular response (HCC) 11/06/2014  . Hypercalcemia 11/06/2014  . Osteopenia 11/06/2014  . Prediabetes 11/06/2014  . GERD (gastroesophageal reflux disease) 11/06/2014  . Allergic rhinitis 11/06/2014    History reviewed. No pertinent surgical history.  Prior to Admission medications   Medication Sig Start Date End Date Taking? Authorizing Provider  acetaminophen (TYLENOL) 500 MG tablet Take 500 mg by mouth 3 (three) times daily as needed (pain).    [provider]  aspirin 81 MG  chewable tablet Chew 81 mg by mouth every morning.    [provider]  atorvastatin (LIPITOR) 10 MG tablet Take 10 mg by mouth every morning.    [provider]  betamethasone dipropionate (DIPROLENE) 0.05 % cream See admin instructions. Apply a thin layer topically to areas of redness/scaling on ankle, knees, back of ands, and low back twice daily as needed for redness/scaling    [provider]  Calcium Carbonate-Vitamin D 600-400 MG-UNIT per tablet Take 1 tablet by mouth 2 (two) times daily.    [provider]  citalopram (CELEXA) 20 MG tablet Take 20 mg by mouth every morning.    [provider]  feeding supplement (BOOST HIGH PROTEIN) LIQD Take 1 Container by mouth every morning.    [provider]  ketoconazole (NIZORAL) 2 % shampoo See admin instructions. Use as directed three times weekly (Monday, Wednesday, and Friday) with shower    [provider]  loratadine (CLARITIN) 10 MG tablet Take 10 mg by mouth daily as needed for allergies.    [provider]  memantine (NAMENDA) 10 MG tablet Take 10 mg by mouth 2 (two) times daily.    [provider]  Multiple Vitamins-Minerals (CENTRUM SILVER PO) Take 1 tablet by mouth every morning.    [provider]  omeprazole (PRILOSEC) 20 MG capsule Take 20 mg by mouth daily as needed (heartburn).    [provider]  OVER THE COUNTER MEDICATION Take 4 oz by mouth every 2 (two) hours. Encourage 4oz fluids    [provider]  polyethylene glycol (MIRALAX / GLYCOLAX) packet Take 17 g by mouth every  morning.    [provider]  triamcinolone cream (KENALOG) 0.1 % See admin instructions. Apply to affected areas of rash at bedtime    [provider]    Allergies Patient has no known allergies.  No family history on file.  Social History Social History  Substance Use Topics  . Smoking status: Former Games developermoker  . Smokeless tobacco:  Never Used  . Alcohol use No    Review of Systems Level V exemption history Limited by the patient's dementia  ____________________________________________   PHYSICAL EXAM:  VITAL SIGNS: ED Triage Vitals [02/09/17 0531]  Enc Vitals Group     BP      Pulse      Resp      Temp      Temp src      SpO2      Weight 165 lb (74.8 kg)     Height 5\' 7"  (1.702 m)     Head Circumference      Peak Flow      Pain Score 0     Pain Loc      Pain Edu?      Excl. in GC?     Constitutional: Pleasant cooperative Eyes: PERRL EOMI. Head: 5 cm abrasion and hematoma to right forehead Nose: No congestion/rhinnorhea. Mouth/Throat: No trismus Neck: No stridor.  No midline tenderness Cardiovascular: Normal rate, regular rhythm. Grossly normal heart sounds.  Good peripheral circulation. Respiratory: Normal respiratory effort.  No retractions. Lungs CTAB and moving good air Gastrointestinal: Soft nontender Musculoskeletal: No lower extremity edema   Neurologic:   No gross focal neurologic deficits are appreciated. Skin:  Skin is warm, dry and intact. No rash noted. Psychiatric: Significant dementia.    ____________________________________________   DIFFERENTIAL includes but not limited to  Abrasion, laceration, intracerebral hemorrhage, syncope ____________________________________________   LABS (all labs ordered are listed, but only abnormal results are displayed)  Labs Reviewed - No data to display   __________________________________________  EKG   ____________________________________________  RADIOLOGY  Head CT with no bleed or fracture   PROCEDURES  Procedure(s) performed: no  Procedures  Critical Care performed: no  Observation: no ____________________________________________   INITIAL IMPRESSION / ASSESSMENT AND PLAN / ED COURSE  Pertinent labs & imaging results that were available during my care of the patient were reviewed by me and considered in my  medical decision making (see chart for details).  The patient arrives with a large right-sided forehead abrasion and hematoma concerning for intracerebral hemorrhage given her fall. Head CT is pending and tetanus will be updated.     Fortunately the patient's head CT is negative for intracerebral pathology. She is moving all 4. She is medically stable for outpatient management. ____________________________________________   FINAL CLINICAL IMPRESSION(S) / ED DIAGNOSES  Final diagnoses:  Fall, initial encounter  Contusion of face, initial encounter      NEW MEDICATIONS STARTED DURING THIS VISIT:  New Prescriptions   No medications on file     Note:  This document was prepared using Dragon voice recognition software and may include unintentional dictation errors.     Merrily Brittleifenbark, Melbert Botelho, MD 02/09/17 [phone]

## 2017-02-09 NOTE — Discharge Instructions (Signed)
Fortunately today your head CT was normal and did not show any stroke or bleeding. Please return to the emergency department for any concerns.  It was a pleasure to take care of you today, and thank you for coming to our emergency department.  If you have any questions or concerns before leaving please ask the nurse to grab me and I'm more than happy to go through your aftercare instructions again.  If you were prescribed any opioid pain medication today such as Norco, Vicodin, Percocet, morphine, hydrocodone, or oxycodone please make sure you do not drive when you are taking this medication as it can alter your ability to drive safely.  If you have any concerns once you are home that you are not improving or are in fact getting worse before you can make it to your follow-up appointment, please do not hesitate to call 911 and come back for further evaluation.  Merrily BrittleNeil Caili Escalera, MD  Results for orders placed or performed during the hospital encounter of 07/23/16  CBC  Result Value Ref Range   WBC 6.5 3.6 - 11.0 K/uL   RBC 3.78 (L) 3.80 - 5.20 MIL/uL   Hemoglobin 12.8 12.0 - 16.0 g/dL   HCT 16.137.4 09.635.0 - 04.547.0 %   MCV 99.0 80.0 - 100.0 fL   MCH 33.8 26.0 - 34.0 pg   MCHC 34.1 32.0 - 36.0 g/dL   RDW 40.913.8 81.111.5 - 91.414.5 %   Platelets 209 150 - 440 K/uL  Comprehensive metabolic panel  Result Value Ref Range   Sodium 141 135 - 145 mmol/L   Potassium 4.6 3.5 - 5.1 mmol/L   Chloride 103 101 - 111 mmol/L   CO2 31 22 - 32 mmol/L   Glucose, Bld 114 (H) 65 - 99 mg/dL   BUN 25 (H) 6 - 20 mg/dL   Creatinine, Ser 7.821.37 (H) 0.44 - 1.00 mg/dL   Calcium 9.8 8.9 - 95.610.3 mg/dL   Total Protein 6.9 6.5 - 8.1 g/dL   Albumin 3.5 3.5 - 5.0 g/dL   AST 30 15 - 41 U/L   ALT 12 (L) 14 - 54 U/L   Alkaline Phosphatase 80 38 - 126 U/L   Total Bilirubin 0.4 0.3 - 1.2 mg/dL   GFR calc non Af Amer 33 (L) >60 mL/min   GFR calc Af Amer 39 (L) >60 mL/min   Anion gap 7 5 - 15  Urinalysis, Complete w Microscopic  Result  Value Ref Range   Color, Urine YELLOW (A) YELLOW   APPearance CLOUDY (A) CLEAR   Specific Gravity, Urine 1.012 1.005 - 1.030   pH 6.0 5.0 - 8.0   Glucose, UA NEGATIVE NEGATIVE mg/dL   Hgb urine dipstick SMALL (A) NEGATIVE   Bilirubin Urine NEGATIVE NEGATIVE   Ketones, ur NEGATIVE NEGATIVE mg/dL   Protein, ur 30 (A) NEGATIVE mg/dL   Nitrite NEGATIVE NEGATIVE   Leukocytes, UA LARGE (A) NEGATIVE   RBC / HPF 6-30 0 - 5 RBC/hpf   WBC, UA TOO NUMEROUS TO COUNT 0 - 5 WBC/hpf   Bacteria, UA NONE SEEN NONE SEEN   Squamous Epithelial / LPF 0-5 (A) NONE SEEN   WBC Clumps PRESENT    Mucous PRESENT    Ct Head Wo Contrast  Result Date: 02/09/2017 CLINICAL DATA:  Pain following fall EXAM: CT HEAD WITHOUT CONTRAST TECHNIQUE: Contiguous axial images were obtained from the base of the skull through the vertex without intravenous contrast. COMPARISON:  July 23, 2016 FINDINGS: Brain: There is  stable diffuse atrophy. There is no intracranial mass, hemorrhage, extra-axial fluid collection, or midline shift. There is small vessel disease throughout much of the centra semiovale bilaterally, stable. No new gray-white compartment lesion is evident. No acute infarct appreciable. Vascular: There is no appreciable hyperdense vessel. There is calcification in each carotid siphon region. There is also calcification in each distal vertebral artery. Skull: Bony calvarium appears intact. There is a right frontal scalp hematoma. Sinuses/Orbits: There is mucosal thickening in multiple ethmoid air cells bilaterally. There is a small air-fluid level in the right sphenoid sinus. Other visualized paranasal sinuses are clear. No intraorbital lesions evident. Other: Mastoid air cells are clear. IMPRESSION: 1. Stable diffuse atrophy with patchy supratentorial small vessel disease. No intracranial mass, hemorrhage, or extra-axial fluid collection. No acute infarct evident. 2.  Right frontal scalp hematoma without underlying fracture.  3.  Areas of paranasal sinus disease. 4.  Foci of arterial vascular calcification noted. Electronically Signed   By: Bretta BangWilliam  Woodruff III M.D.   On: 02/09/2017 07:16

## 2017-02-09 NOTE — ED Notes (Signed)
[facility]   [phone]

## 2017-02-09 NOTE — ED Triage Notes (Signed)
[facility] with c/o fall. Pt has small hematoma to right side of head. Pt is on no blood thinners with no cardiac hx. Pt is in NAD at this time.

## 2017-04-18 ENCOUNTER — Emergency Department
Admission: EM | Admit: 2017-04-18 | Discharge: 2017-04-18 | Disposition: A | Discharge status: Home / Self Care | Payer: Medicare Other | Attending: Emergency Medicine | Admitting: Emergency Medicine

## 2017-04-18 ENCOUNTER — Emergency Department: Payer: Medicare Other

## 2017-04-18 DIAGNOSIS — R5381 Other malaise: Secondary | ICD-10-CM | POA: Insufficient documentation

## 2017-04-18 DIAGNOSIS — Z7982 Long term (current) use of aspirin: Secondary | ICD-10-CM | POA: Diagnosis not present

## 2017-04-18 DIAGNOSIS — N390 Urinary tract infection, site not specified: Secondary | ICD-10-CM

## 2017-04-18 DIAGNOSIS — F0390 Unspecified dementia without behavioral disturbance: Secondary | ICD-10-CM | POA: Insufficient documentation

## 2017-04-18 DIAGNOSIS — Z87891 Personal history of nicotine dependence: Secondary | ICD-10-CM | POA: Insufficient documentation

## 2017-04-18 DIAGNOSIS — Z79899 Other long term (current) drug therapy: Secondary | ICD-10-CM | POA: Insufficient documentation

## 2017-04-18 DIAGNOSIS — R4182 Altered mental status, unspecified: Secondary | ICD-10-CM | POA: Diagnosis present

## 2017-04-18 LAB — CBC WITH DIFFERENTIAL/PLATELET
BASOS PCT: 0 %
Basophils Absolute: 0 10*3/uL (ref 0–0.1)
EOS ABS: 0.1 10*3/uL (ref 0–0.7)
Eosinophils Relative: 2 %
HEMATOCRIT: 34.8 % — AB (ref 35.0–47.0)
HEMOGLOBIN: 11.7 g/dL — AB (ref 12.0–16.0)
LYMPHS ABS: 1.2 10*3/uL (ref 1.0–3.6)
Lymphocytes Relative: 16 %
MCH: 31.8 pg (ref 26.0–34.0)
MCHC: 33.5 g/dL (ref 32.0–36.0)
MCV: 94.9 fL (ref 80.0–100.0)
Monocytes Absolute: 0.6 10*3/uL (ref 0.2–0.9)
Monocytes Relative: 8 %
NEUTROS PCT: 74 %
Neutro Abs: 5.4 10*3/uL (ref 1.4–6.5)
Platelets: 253 10*3/uL (ref 150–440)
RBC: 3.67 MIL/uL — AB (ref 3.80–5.20)
RDW: 13.9 % (ref 11.5–14.5)
WBC: 7.2 10*3/uL (ref 3.6–11.0)

## 2017-04-18 LAB — URINALYSIS, COMPLETE (UACMP) WITH MICROSCOPIC
BILIRUBIN URINE: NEGATIVE
Bacteria, UA: NONE SEEN
GLUCOSE, UA: NEGATIVE mg/dL
Ketones, ur: NEGATIVE mg/dL
NITRITE: NEGATIVE
PH: 7 (ref 5.0–8.0)
PROTEIN: 30 mg/dL — AB
SPECIFIC GRAVITY, URINE: 1.01 (ref 1.005–1.030)

## 2017-04-18 LAB — COMPREHENSIVE METABOLIC PANEL
ALK PHOS: 93 U/L (ref 38–126)
ALT: 12 U/L — ABNORMAL LOW (ref 14–54)
ANION GAP: 8 (ref 5–15)
AST: 22 U/L (ref 15–41)
Albumin: 3.5 g/dL (ref 3.5–5.0)
BILIRUBIN TOTAL: 0.5 mg/dL (ref 0.3–1.2)
BUN: 24 mg/dL — ABNORMAL HIGH (ref 6–20)
CALCIUM: 9.2 mg/dL (ref 8.9–10.3)
CO2: 29 mmol/L (ref 22–32)
Chloride: 106 mmol/L (ref 101–111)
Creatinine, Ser: 1.12 mg/dL — ABNORMAL HIGH (ref 0.44–1.00)
GFR, EST AFRICAN AMERICAN: 49 mL/min — AB (ref >60)
GFR, EST NON AFRICAN AMERICAN: 42 mL/min — AB (ref >60)
GLUCOSE: 162 mg/dL — AB (ref 65–99)
Potassium: 4 mmol/L (ref 3.5–5.1)
Sodium: 143 mmol/L (ref 135–145)
TOTAL PROTEIN: 7 g/dL (ref 6.5–8.1)

## 2017-04-18 MED ORDER — CEFDINIR 250 MG/5ML PO SUSR: 300.0000 mg | Freq: Two times a day (BID) | ORAL | 0 refills | Status: DC

## 2017-04-18 MED ORDER — CEFTRIAXONE SODIUM IN DEXTROSE 20 MG/ML IV SOLN
1.0000 g | Freq: Once | INTRAVENOUS | Status: AC
  Administered 2017-04-18: 1 g via INTRAVENOUS
  Filled 2017-04-18: qty 50

## 2017-04-18 NOTE — Discharge Instructions (Signed)
Your Chest xray and CT scan of the head were unremarkable.  Your lab tests today reveal a urinary tract infection.  You were given a dose of antibiotics in the ER today.  Starting tomorrow (04/19/17), take omnicef solution as prescribed to continue treating this infection. Follow up with your doctor for continued monitoring of your symptoms.

## 2017-04-18 NOTE — ED Provider Notes (Signed)
[facility] Emergency Department Provider Note  ____________________________________________  Time seen: Approximately 4:10 PM  I have reviewed the triage vital signs and the nursing notes.   HISTORY  Chief Complaint Medical Clearance  Level 5 Caveat: Portions of the History and Physical were unable to be obtained due to altered mental status due to chronic dementia.   HPI Molly Jimenez is a 83 y.o. female sent [facility] the [facility] due to not seeming herself today. The staff at her nursing home admit that they are not familiar with her despite her being there for many years and are not sure what her baseline is. He reports that she is not ambulatory and is bedbound, doesn't speak coherently, and has dementia.     Past Medical History:  Diagnosis Date  . GERD (gastroesophageal reflux disease)   . Hypercholesteremia   . Renal disorder      Patient Active Problem List   Diagnosis Date Noted  . Secondary hyperparathyroidism of renal origin (HCC) 11/10/2014  . Bradycardia   . Diastolic dysfunction 11/08/2014  . Subclinical hyperthyroidism 11/08/2014  . Polyclonal gammopathy determined by serum protein electrophoresis 11/08/2014  . Metabolic encephalopathy   . Hypernatremia 11/06/2014  . Acute renal failure superimposed on stage 3 chronic kidney disease (HCC) 11/06/2014  . UTI (urinary tract infection) 11/06/2014  . Atrial fibrillation with rapid ventricular response (HCC) 11/06/2014  . Hypercalcemia 11/06/2014  . Osteopenia 11/06/2014  . Prediabetes 11/06/2014  . GERD (gastroesophageal reflux disease) 11/06/2014  . Allergic rhinitis 11/06/2014     History reviewed. No pertinent surgical history.   Prior to Admission medications   Medication Sig Start Date End Date Taking? Authorizing Provider  acetaminophen (TYLENOL) 325 MG tablet Take 650 mg by mouth 3 (three) times daily.    Yes [provider]   aspirin 81 MG chewable tablet Chew 81 mg by mouth every morning.   Yes [provider]  atorvastatin (LIPITOR) 10 MG tablet Take 10 mg by mouth every morning.   Yes [provider]  betamethasone dipropionate (DIPROLENE) 0.05 % cream See admin instructions. Apply a thin layer topically to areas of redness/scaling on ankle, knees, back of ands, and low back twice daily as needed for redness/scaling   Yes [provider]  Calcium Carbonate-Vitamin D 600-400 MG-UNIT per tablet Take 1 tablet by mouth 2 (two) times daily.   Yes [provider]  chlorhexidine (PERIDEX) 0.12 % solution Use as directed 15 mLs in the mouth or throat at bedtime.   Yes [provider]  citalopram (CELEXA) 20 MG tablet Take 20 mg by mouth every morning.   Yes [provider]  feeding supplement (BOOST HIGH PROTEIN) LIQD Take 1 Container by mouth every morning.   Yes [provider]  guaifenesin (ROBITUSSIN) 100 MG/5ML syrup Take 200 mg by mouth 3 (three) times daily as needed for cough or congestion.   Yes [provider]  ketoconazole (NIZORAL) 2 % shampoo See admin instructions. Use as directed three times weekly (Monday, Wednesday, and Friday) with shower   Yes [provider]  loperamide (IMODIUM A-D) 2 MG tablet Take 2 mg by mouth every 12 (twelve) hours as needed for diarrhea or loose stools.   Yes [provider]  loratadine (CLARITIN) 10 MG tablet Take 10 mg by mouth daily as needed for allergies.   Yes [provider]  memantine (NAMENDA) 10 MG tablet Take 10 mg by mouth 2 (two)  times daily.   Yes [provider]  Multiple Vitamins-Minerals (CENTRUM SILVER PO) Take 1 tablet by mouth every morning.   Yes [provider]  omeprazole (PRILOSEC) 20 MG capsule Take 20 mg by mouth daily as needed (heartburn).   Yes [provider]  OVER THE COUNTER MEDICATION Take 4 oz by mouth every 2 (two) hours.  Encourage 4oz fluids   Yes [provider]  polyethylene glycol (MIRALAX / GLYCOLAX) packet Take 17 g by mouth every morning.   Yes [provider]  sodium fluoride (DENTAGEL) 1.1 % GEL dental gel Place 1 application onto teeth 2 (two) times daily.   Yes [provider]  triamcinolone cream (KENALOG) 0.1 % See admin instructions. Apply to affected areas of rash at bedtime   Yes [provider]  cefdinir (OMNICEF) 250 MG/5ML suspension Take 6 mLs (300 mg total) by mouth 2 (two) times daily. 04/18/17   Sharman Cheek, MD     Allergies Patient has no known allergies.   No family history on file.  Social History Social History  Substance Use Topics  . Smoking status: Former Games developer  . Smokeless tobacco: Never Used  . Alcohol use No    Review of Systems unable to obtain due to altered mental status   ____________________________________________   PHYSICAL EXAM:  VITAL SIGNS: ED Triage Vitals  Enc Vitals Group     BP --      Pulse --      Resp --      Temp --      Temp src --      SpO2 04/18/17 1541 97 %     Weight 04/18/17 1543 150 lb (68 kg)     Height 04/18/17 1543  (1.676 m)     Head Circumference --      Peak Flow --      Pain Score --      Pain Loc --      Pain Edu? --      Excl. in GC? --     Vital signs reviewed, nursing assessments reviewed.   Constitutional:   awake. Disoriented. Not in distress Eyes:   No scleral icterus.  EOMI.  PERRL. ENT   Head:   Normocephalic and atraumatic.   Nose:   No congestion/rhinnorhea.    Mouth/Throat:   MMM.    Neck:   No meningismus. Full ROM.no midline tenderness Hematological/Lymphatic/Immunilogical:   No cervical lymphadenopathy. Cardiovascular:   irregularly irregular rhythm, rate approximately 80. Symmetric bilateral radial and DP pulses.  No murmurs.  Respiratory:   Normal respiratory effort without tachypnea/retractions. Breath sounds are clear and equal  bilaterally. No wheezes/rales/rhonchi. Gastrointestinal:   Soft and nontender. Non distended. There is no CVA tenderness.  No rebound, rigidity, or guarding. Genitourinary:   no swelling or inflammation. There are some scattered red bumps in the diaper area. Diaper is heavily soaked with urine. Musculoskeletal:   Limited range of motion in bilateral lower legs. No joint effusions.  No lower extremity tenderness.  No edema. Neurologic:   grunting sounds, no coherent speech  Motor grossly intact, moving all extremities. No acute focal neurologic deficits are appreciated.  Skin:    Skin is warm, dry and intact. scattered erythematous rash in the perineum.  No petechiae, purpura, or bullae.  ____________________________________________    LABS (pertinent positives/negatives) (all labs ordered are listed, but only abnormal results are displayed) Labs Reviewed  CBC WITH DIFFERENTIAL/PLATELET - Abnormal; Notable for the following:  Result Value   RBC 3.67 (*)    Hemoglobin 11.7 (*)    HCT 34.8 (*)    All other components within normal limits  COMPREHENSIVE METABOLIC PANEL - Abnormal; Notable for the following:    Glucose, Bld 162 (*)    BUN 24 (*)    Creatinine, Ser 1.12 (*)    ALT 12 (*)    GFR calc non Af Amer 42 (*)    GFR calc Af Amer 49 (*)    All other components within normal limits  URINALYSIS, COMPLETE (UACMP) WITH MICROSCOPIC - Abnormal; Notable for the following:    Color, Urine YELLOW (*)    APPearance HAZY (*)    Hgb urine dipstick MODERATE (*)    Protein, ur 30 (*)    Leukocytes, UA LARGE (*)    Squamous Epithelial / LPF 0-5 (*)    All other components within normal limits  URINE CULTURE   ____________________________________________   EKG  interpreted by me A. fib, rate of 84. Normal axis and intervals. Normal QRS ST segments and T waves.  ____________________________________________    RADIOLOGY  Ct Head Wo Contrast  Result Date:  04/18/2017 CLINICAL DATA:  Per note in pt chart "Pt arrived via ems from [facility] - they called stating that pt "was not acting normal" yet they were unable to explain what her baseline was. EXAM: CT HEAD WITHOUT CONTRAST TECHNIQUE: Contiguous axial images were obtained from the base of the skull through the vertex without intravenous contrast. COMPARISON:  02/09/2017 FINDINGS: Brain: No evidence of acute infarction, hemorrhage, extra-axial collection, ventriculomegaly, or mass effect. Generalized cerebral atrophy. Periventricular white matter low attenuation likely secondary to microangiopathy. Vascular: Cerebrovascular atherosclerotic calcifications are noted. Skull: Negative for fracture or focal lesion. Sinuses/Orbits: Visualized portions of the orbits are unremarkable. Visualized portions of the paranasal sinuses and mastoid air cells are unremarkable. Other: None. IMPRESSION: 1. No acute intracranial pathology. 2. Chronic microvascular disease and cerebral atrophy. Electronically Signed   By: Elige Ko   On: 04/18/2017 16:21   Dg Chest Portable 1 View  Result Date: 04/18/2017 CLINICAL DATA:  83 year old female with history of weakness and altered mental status. EXAM: PORTABLE CHEST 1 VIEW COMPARISON:  Chest x-ray 07/23/2016. FINDINGS: Lung volumes are low. No definite consolidative airspace disease. No pleural effusions. No evidence of pulmonary edema. Heart size is normal. The patient is rotated to the left on today's exam, resulting in distortion of the mediastinal contours and reduced diagnostic sensitivity and specificity for mediastinal pathology. Atherosclerosis in the thoracic aorta. Post procedural changes of vertebroplasty are noted in the lower thoracic spine. IMPRESSION: 1. Low lung volumes without radiographic evidence of acute cardiopulmonary disease. 2. Aortic atherosclerosis. Electronically Signed   By: Trudie Reed M.D.   On: 04/18/2017 16:08     ____________________________________________   PROCEDURES Procedures  ____________________________________________   INITIAL IMPRESSION / ASSESSMENT AND PLAN / ED COURSE  Pertinent labs & imaging results that were available during my care of the patient were reviewed by me and considered in my medical decision making (see chart for details). due to lack of history, past history is available in electronic medical record was reviewed, including the patient's medication list. There are no acute complaints that a been reported, but again because we are not able to obtain any reliable history, a screening workup will be undertaken including chest x-ray CT head urinalysis and serum labs. Given her age, occult infection is a possibility including urinary tract infection, pneumonia, soft tissue  infection. Don't see any evidence of cellulitis abscess necrotizing fasciitis. I doubt septic arthritis. Unlikely to be meningitis or encephalitis. If workup is negative, patient will be suitable for discharge home for outpatient follow-up with her primary care doctor.   Clinical Course as of Apr 19 1747  Sat Apr 18, 2017  1704 UA positive for UTI. Will start abx.  Other labs unremarkable with nl electrolytes.  CT head and CXR unremarkable, no evidence of stroke/bleed, no evidence of pneumonia.   [PS]    Clinical Course User Index [PS] Sharman Cheek, MD     ____________________________________________   FINAL CLINICAL IMPRESSION(S) / ED DIAGNOSES  Final diagnoses:  Malaise  Lower urinary tract infection      New Prescriptions   CEFDINIR (OMNICEF) 250 MG/5ML SUSPENSION    Take 6 mLs (300 mg total) by mouth 2 (two) times daily.     Portions of this note were generated with dragon dictation software. Dictation errors may occur despite best attempts at proofreading.    Sharman Cheek, MD 04/18/17 [phone]

## 2017-04-18 NOTE — ED Notes (Signed)
Attempted to call report to [facility] x2 but no one answered

## 2017-04-18 NOTE — ED Notes (Signed)
In and out cath performed by Marylene Land RN without difficulty

## 2017-04-18 NOTE — ED Triage Notes (Addendum)
Pt arrived via ems from [facility] - they called stating that pt "was not acting normal" yet they were unable to explain what her baseline was - pt is here for medical clearance

## 2017-04-20 LAB — URINE CULTURE

## 2017-05-04 ENCOUNTER — Encounter: Payer: Self-pay | Admitting: Emergency Medicine

## 2017-05-04 ENCOUNTER — Emergency Department: Payer: Medicare Other

## 2017-05-04 ENCOUNTER — Emergency Department
Admission: EM | Admit: 2017-05-04 | Discharge: 2017-05-04 | Disposition: A | Discharge status: Skilled Nursing Facility | Payer: Medicare Other | Attending: Emergency Medicine | Admitting: Emergency Medicine

## 2017-05-04 DIAGNOSIS — R0981 Nasal congestion: Secondary | ICD-10-CM

## 2017-05-04 DIAGNOSIS — N183 Chronic kidney disease, stage 3 (moderate): Secondary | ICD-10-CM | POA: Insufficient documentation

## 2017-05-04 DIAGNOSIS — N390 Urinary tract infection, site not specified: Secondary | ICD-10-CM | POA: Diagnosis not present

## 2017-05-04 DIAGNOSIS — Z87891 Personal history of nicotine dependence: Secondary | ICD-10-CM | POA: Insufficient documentation

## 2017-05-04 LAB — URINALYSIS, COMPLETE (UACMP) WITH MICROSCOPIC
Bacteria, UA: NONE SEEN
Bilirubin Urine: NEGATIVE
GLUCOSE, UA: NEGATIVE mg/dL
Ketones, ur: NEGATIVE mg/dL
NITRITE: NEGATIVE
PH: 7 (ref 5.0–8.0)
PROTEIN: NEGATIVE mg/dL
SPECIFIC GRAVITY, URINE: 1.011 (ref 1.005–1.030)
Squamous Epithelial / LPF: NONE SEEN

## 2017-05-04 LAB — COMPREHENSIVE METABOLIC PANEL
ALT: 15 U/L (ref 14–54)
AST: 34 U/L (ref 15–41)
Albumin: 3.7 g/dL (ref 3.5–5.0)
Alkaline Phosphatase: 88 U/L (ref 38–126)
Anion gap: 12 (ref 5–15)
BILIRUBIN TOTAL: 0.8 mg/dL (ref 0.3–1.2)
BUN: 25 mg/dL — AB (ref 6–20)
CHLORIDE: 102 mmol/L (ref 101–111)
CO2: 29 mmol/L (ref 22–32)
Calcium: 10.4 mg/dL — ABNORMAL HIGH (ref 8.9–10.3)
Creatinine, Ser: 1.19 mg/dL — ABNORMAL HIGH (ref 0.44–1.00)
GFR, EST AFRICAN AMERICAN: 46 mL/min — AB (ref >60)
GFR, EST NON AFRICAN AMERICAN: 39 mL/min — AB (ref >60)
Glucose, Bld: 111 mg/dL — ABNORMAL HIGH (ref 65–99)
Potassium: 4.3 mmol/L (ref 3.5–5.1)
Sodium: 143 mmol/L (ref 135–145)
TOTAL PROTEIN: 7.5 g/dL (ref 6.5–8.1)

## 2017-05-04 LAB — CBC
HEMATOCRIT: 36 % (ref 35.0–47.0)
Hemoglobin: 11.7 g/dL — ABNORMAL LOW (ref 12.0–16.0)
MCH: 30.9 pg (ref 26.0–34.0)
MCHC: 32.5 g/dL (ref 32.0–36.0)
MCV: 94.9 fL (ref 80.0–100.0)
PLATELETS: 246 10*3/uL (ref 150–440)
RBC: 3.79 MIL/uL — ABNORMAL LOW (ref 3.80–5.20)
RDW: 14.1 % (ref 11.5–14.5)
WBC: 9.4 10*3/uL (ref 3.6–11.0)

## 2017-05-04 LAB — INFLUENZA PANEL BY PCR (TYPE A & B)
INFLBPCR: NEGATIVE
Influenza A By PCR: NEGATIVE

## 2017-05-04 MED ORDER — CEFTRIAXONE SODIUM IN DEXTROSE 20 MG/ML IV SOLN
1.0000 g | Freq: Once | INTRAVENOUS | Status: AC
  Administered 2017-05-04: 1 g via INTRAVENOUS
  Filled 2017-05-04: qty 50

## 2017-05-04 MED ORDER — CEPHALEXIN 500 MG PO CAPS: 500.0000 mg | ORAL_CAPSULE | Freq: Two times a day (BID) | ORAL | 0 refills | Status: DC

## 2017-05-04 NOTE — ED Notes (Signed)
Left message to call back for Molly Jimenez, pt DSS guardian for update on plan of care.

## 2017-05-04 NOTE — ED Triage Notes (Signed)
[facility] nasal congestion, rhonchi and shaking for several days. Pt is reported at baseline behavior. EMS reports 98% RA, 50-60 HR, NSR, CBG 185, 138/68. Pt in no apparent distress on arrival. Pt presents with clear nasal drainage.

## 2017-05-04 NOTE — ED Notes (Signed)
[facility]  CALLED TO  TRANSPORT  PT  BACK TO [facility] 

## 2017-05-04 NOTE — Discharge Instructions (Addendum)
There are signs of a urinary tract infection.  An antibiotic has been given in the ER and should be continued at the home.    You may have an upper respiratory infection causing your runny nose, but there is no evidence for bacterial infection.  Return to the emergency department if you develop severe pain, lightheadedness or fainting, fever, shortness of breath, or any other symptoms concerning to you.

## 2017-05-04 NOTE — ED Provider Notes (Signed)
[facility] Emergency Department Provider Note  ____________________________________________  Time seen: Approximately 12:43 PM  I have reviewed the triage vital signs and the nursing notes.   HISTORY  Chief Complaint URI  The patient's history and physical are limited due to her altered mental status, nonverbal state and inability to follow commands.  HPI Molly Jimenez is a 83 y.o. female with a history of metabolic encephalopathy, A. Fib, presenting forseveral days of nasal congestion, rhonchi and shaking. This is the report from EMS, said patient is unable to give any history. EMS reports normal oxygen saturations, mild bradycardia with normal sinus rhythm, normal blood pressure, and a blood sugar 158.   Past Medical History:  Diagnosis Date  . GERD (gastroesophageal reflux disease)   . Hypercholesteremia   . Renal disorder     Patient Active Problem List   Diagnosis Date Noted  . Secondary hyperparathyroidism of renal origin (HCC) 11/10/2014  . Bradycardia   . Diastolic dysfunction 11/08/2014  . Subclinical hyperthyroidism 11/08/2014  . Polyclonal gammopathy determined by serum protein electrophoresis 11/08/2014  . Metabolic encephalopathy   . Hypernatremia 11/06/2014  . Acute renal failure superimposed on stage 3 chronic kidney disease (HCC) 11/06/2014  . UTI (urinary tract infection) 11/06/2014  . Atrial fibrillation with rapid ventricular response (HCC) 11/06/2014  . Hypercalcemia 11/06/2014  . Osteopenia 11/06/2014  . Prediabetes 11/06/2014  . GERD (gastroesophageal reflux disease) 11/06/2014  . Allergic rhinitis 11/06/2014    No past surgical history on file.  Current Outpatient Rx  . Order #: [id number] Class: Historical Med  . Order #: [id number] Class: Historical Med  . Order #: [id number] Class: Historical Med  . Order #: [id number] Class: Historical Med  . Order #: [id number] Class: Historical Med  . Order #: [id number] Class:  Historical Med  . Order #: [id number] Class: Historical Med  . Order #: [id number] Class: Historical Med  . Order #: [id number] Class: Historical Med  . Order #: [id number] Class: Historical Med  . Order #: [id number] Class: Historical Med  . Order #: [id number] Class: Historical Med  . Order #: [id number] Class: Print  . Order #: [id number] Class: Historical Med  . Order #: [id number] Class: Historical Med  . Order #: [id number] Class: Historical Med  . Order #: [id number] Class: Historical Med  . Order #: [id number] Class: Historical Med  . Order #: [id number] Class: Historical Med    Allergies Patient has no known allergies.  No family history on file.  Social History Social History  Substance Use Topics  . Smoking status: Former Games developer  . Smokeless tobacco: Never Used  . Alcohol use No    Review of Systems Unable to obtain due to nonverbal state.  ____________________________________________   PHYSICAL EXAM:  VITAL SIGNS: ED Triage Vitals  Enc Vitals Group     BP 05/04/17 1226 139/85     Pulse Rate 05/04/17 1226 99     Resp 05/04/17 1226 18     Temp 05/04/17 1226 (!) 97.5 F (36.4 C)     Temp Source 05/04/17 1226 Oral     SpO2 05/04/17 1226 100 %     Weight 05/04/17 1228 150 lb (68 kg)     Height --      Head Circumference --      Peak Flow --      Pain Score --      Pain Loc --      Pain Edu? --      Excl. in GC? --     Constitutional: the patient is alert and follows me with her  eyes, but does not answer any questions with verbal response. She is comfortable appearing lying in the stretcher. Eyes: Conjunctivae are normal.  EOMI. Pupils are 2 mm and reactive bilaterally.No scleral icterus. Head: Atraumatic. Nose: No congestion/rhinnorhea. Mouth/Throat: Mucous membranes are moist.  Neck: No stridor.  Supple.  o JVD. No meningismus. Cardiovascular: Normal rate, regular rhythm. No murmurs, rubs or gallops.  Respiratory: Normal respiratory effort.  No accessory muscle use or  retractions. Lungs CTAB.  No wheezes, rales or ronchi. Gastrointestinal: obese. Soft, nontender and nondistended.  No guarding or rebound.  No peritoneal signs. Musculoskeletal: No LE edema. No ttp in the calves or palpable cords.  Negative Homan's sign. Neurologic:  Alert. Nonverbal. Protecting her airway.  Face and smile are symmetric.  EOMI.  Moves all extremities well. Skin:  Skin is warm, dry and intact. No rash noted. Psychiatric: and able to assess due to patient mental status.  ____________________________________________   LABS (all labs ordered are listed, but only abnormal results are displayed)  Labs Reviewed  CBC - Abnormal; Notable for the following:       Result Value   RBC 3.79 (*)    Hemoglobin 11.7 (*)    All other components within normal limits  COMPREHENSIVE METABOLIC PANEL - Abnormal; Notable for the following:    Glucose, Bld 111 (*)    BUN 25 (*)    Creatinine, Ser 1.19 (*)    Calcium 10.4 (*)    GFR calc non Af Amer 39 (*)    GFR calc Af Amer 46 (*)    All other components within normal limits  RAPID INFLUENZA A&B ANTIGENS ([facility] ONLY)  URINALYSIS, COMPLETE (UACMP) WITH MICROSCOPIC   ____________________________________________  EKG  ED ECG REPORT I, Rockne Menghini, the attending physician, personally viewed and interpreted this ECG.   Date: 05/04/2017  EKG Time: 1342  Rate: 57  Rhythm: sinus bradycardia  Axis: leftward  Intervals:none  ST&T Change: No STEMI  ____________________________________________  RADIOLOGY  Dg Chest 2 View  Result Date: 05/04/2017 CLINICAL DATA:  Cough. EXAM: CHEST  2 VIEW COMPARISON:  Radiograph of April 18, 2017. FINDINGS: Stable cardiomediastinal silhouette. Atherosclerosis of thoracic aorta is noted. No pneumothorax or significant pleural effusion is noted. Mild bibasilar subsegmental atelectasis is noted. Severe degenerative changes seen involving the right glenohumeral joint. IMPRESSION: Aortic  atherosclerosis.  Mild bibasilar subsegmental atelectasis. Electronically Signed   By: Lupita Raider, M.D.   On: 05/04/2017 13:32    ____________________________________________   PROCEDURES  Procedure(s) performed: None  Procedures  Critical Care performed: No ____________________________________________   INITIAL IMPRESSION / ASSESSMENT AND PLAN / ED COURSE  Pertinent labs & imaging results that were available during my care of the patient were reviewed by me and considered in my medical decision making (see chart for details).  83 y.o. Female presenting from her nursing home for several days of nasal congestion and shaking. Overall, the patient is hemodynamically stable and has normal blood sugar. We will do basic labs, urinalysis, influenza testing, and get a chest x-ray, as well as a screening EKG. If the patient's workup is reassuring in the emergency department, anticipate discharge back to her nursing facility.  ----------------------------------------- 2:55 PM on 05/04/2017 -----------------------------------------  The patient's workup in the emergency department has been reassuring.the patient has no evidence of infection. She has a chronic renal insufficiency. ____________________________________________  FINAL CLINICAL IMPRESSION(S) / ED DIAGNOSES  Final diagnoses:  Nasal congestion         NEW MEDICATIONS STARTED  DURING THIS VISIT:  New Prescriptions   No medications on file      Rockne MenghiniNorman, Anne-Caroline, MD 05/04/17 1459

## 2017-05-07 ENCOUNTER — Encounter: Payer: Self-pay | Admitting: *Deleted

## 2017-05-07 ENCOUNTER — Emergency Department
Admission: EM | Admit: 2017-05-07 | Discharge: 2017-05-07 | Disposition: A | Discharge status: Home / Self Care | Payer: Medicare Other | Attending: Emergency Medicine | Admitting: Emergency Medicine

## 2017-05-07 ENCOUNTER — Emergency Department: Payer: Medicare Other

## 2017-05-07 DIAGNOSIS — Z87891 Personal history of nicotine dependence: Secondary | ICD-10-CM | POA: Insufficient documentation

## 2017-05-07 DIAGNOSIS — Z7982 Long term (current) use of aspirin: Secondary | ICD-10-CM | POA: Diagnosis not present

## 2017-05-07 DIAGNOSIS — J189 Pneumonia, unspecified organism: Secondary | ICD-10-CM | POA: Insufficient documentation

## 2017-05-07 DIAGNOSIS — Z79899 Other long term (current) drug therapy: Secondary | ICD-10-CM | POA: Diagnosis not present

## 2017-05-07 DIAGNOSIS — E0590 Thyrotoxicosis, unspecified without thyrotoxic crisis or storm: Secondary | ICD-10-CM | POA: Insufficient documentation

## 2017-05-07 DIAGNOSIS — N183 Chronic kidney disease, stage 3 (moderate): Secondary | ICD-10-CM | POA: Diagnosis not present

## 2017-05-07 DIAGNOSIS — R0602 Shortness of breath: Secondary | ICD-10-CM | POA: Diagnosis present

## 2017-05-07 DIAGNOSIS — R7303 Prediabetes: Secondary | ICD-10-CM | POA: Insufficient documentation

## 2017-05-07 DIAGNOSIS — J181 Lobar pneumonia, unspecified organism: Secondary | ICD-10-CM

## 2017-05-07 LAB — BASIC METABOLIC PANEL
ANION GAP: 11 (ref 5–15)
ANION GAP: 9 (ref 5–15)
BUN: 29 mg/dL — AB (ref 6–20)
BUN: 28 mg/dL — ABNORMAL HIGH (ref 6–20)
CHLORIDE: 106 mmol/L (ref 101–111)
CO2: 30 mmol/L (ref 22–32)
CO2: 28 mmol/L (ref 22–32)
Calcium: 10.1 mg/dL (ref 8.9–10.3)
Calcium: 9.6 mg/dL (ref 8.9–10.3)
Chloride: 108 mmol/L (ref 101–111)
Creatinine, Ser: 1.42 mg/dL — ABNORMAL HIGH (ref 0.44–1.00)
Creatinine, Ser: 1.35 mg/dL — ABNORMAL HIGH (ref 0.44–1.00)
GFR calc Af Amer: 37 mL/min — ABNORMAL LOW (ref >60)
GFR calc non Af Amer: 34 mL/min — ABNORMAL LOW (ref >60)
GFR, EST AFRICAN AMERICAN: 39 mL/min — AB (ref >60)
GFR, EST NON AFRICAN AMERICAN: 32 mL/min — AB (ref >60)
GLUCOSE: 149 mg/dL — AB (ref 65–99)
Glucose, Bld: 139 mg/dL — ABNORMAL HIGH (ref 65–99)
POTASSIUM: 4.3 mmol/L (ref 3.5–5.1)
POTASSIUM: 4.2 mmol/L (ref 3.5–5.1)
SODIUM: 147 mmol/L — AB (ref 135–145)
Sodium: 145 mmol/L (ref 135–145)

## 2017-05-07 LAB — TROPONIN I

## 2017-05-07 LAB — CBC WITH DIFFERENTIAL/PLATELET
BASOS ABS: 0 10*3/uL (ref 0–0.1)
Basophils Relative: 0 %
EOS ABS: 0.1 10*3/uL (ref 0–0.7)
Eosinophils Relative: 1 %
HCT: 35.1 % (ref 35.0–47.0)
HEMOGLOBIN: 11.4 g/dL — AB (ref 12.0–16.0)
LYMPHS ABS: 0.8 10*3/uL — AB (ref 1.0–3.6)
LYMPHS PCT: 9 %
MCH: 30.8 pg (ref 26.0–34.0)
MCHC: 32.6 g/dL (ref 32.0–36.0)
MCV: 94.3 fL (ref 80.0–100.0)
Monocytes Absolute: 0.4 10*3/uL (ref 0.2–0.9)
Monocytes Relative: 4 %
NEUTROS PCT: 86 %
Neutro Abs: 7.3 10*3/uL — ABNORMAL HIGH (ref 1.4–6.5)
Platelets: 240 10*3/uL (ref 150–440)
RBC: 3.72 MIL/uL — AB (ref 3.80–5.20)
RDW: 14.5 % (ref 11.5–14.5)
WBC: 8.6 10*3/uL (ref 3.6–11.0)

## 2017-05-07 MED ORDER — AMOXICILLIN-POT CLAVULANATE 875-125 MG PO TABS: 1.0000 | ORAL_TABLET | Freq: Two times a day (BID) | ORAL | 0 refills | Status: DC

## 2017-05-07 MED ORDER — SODIUM CHLORIDE 0.9 % IV BOLUS (SEPSIS)
500.0000 mL | Freq: Once | INTRAVENOUS | Status: AC
  Administered 2017-05-07: 500 mL via INTRAVENOUS

## 2017-05-07 MED ORDER — SODIUM CHLORIDE 0.9 % IV BOLUS (SEPSIS): 1000.0000 mL | Freq: Once | INTRAVENOUS | Status: DC

## 2017-05-07 MED ORDER — DEXTROSE 5 % IV SOLN
500.0000 mg | Freq: Once | INTRAVENOUS | Status: AC
  Administered 2017-05-07: 500 mg via INTRAVENOUS
  Filled 2017-05-07: qty 500

## 2017-05-07 MED ORDER — DOXYCYCLINE HYCLATE 100 MG PO CAPS: 100.0000 mg | ORAL_CAPSULE | Freq: Two times a day (BID) | ORAL | 0 refills | Status: DC

## 2017-05-07 MED ORDER — CEFTRIAXONE SODIUM IN DEXTROSE 20 MG/ML IV SOLN
1.0000 g | Freq: Once | INTRAVENOUS | Status: AC
  Administered 2017-05-07: 1 g via INTRAVENOUS
  Filled 2017-05-07: qty 50

## 2017-05-07 NOTE — ED Notes (Signed)
RN called [facility] to give report. Kendal HymenBonnie reports Dr. Don PerkingVeronese called report already and she did not have any further questions. No family to call and no transportation available other than EMS.

## 2017-05-07 NOTE — ED Provider Notes (Signed)
[facility] Emergency Department Provider Note  ____________________________________________  Time seen: Approximately 12:49 PM  I have reviewed the triage vital signs and the nursing notes.   HISTORY  Chief Complaint Shortness of Breath  Level 5 caveat:  Portions of the history and physical were unable to be obtained due to nonverbal and advanced dementia   HPI Molly JacksonGrace E Jimenez is a 83 y.o. female with a history of diastolic CHF with preserved EF in 2016, hyperlipidemia, atrial fibrillation, chronic kidney disease who presents for evaluation of difficulty breathing.per EMS patient was diagnosed with a UTI yesterday and started on Keflex. Today she started having progressively worsening shortness of breath, increased work of breathing and cough. No fever, vomiting, or diarrhea. Patient is nonverbal which is her baseline. Per EMS patient was hypoxic on RA to mid 80s and placed on NRB.   Past Medical History:  Diagnosis Date  . GERD (gastroesophageal reflux disease)   . Hypercholesteremia   . Renal disorder     Patient Active Problem List   Diagnosis Date Noted  . Secondary hyperparathyroidism of renal origin (HCC) 11/10/2014  . Bradycardia   . Diastolic dysfunction 11/08/2014  . Subclinical hyperthyroidism 11/08/2014  . Polyclonal gammopathy determined by serum protein electrophoresis 11/08/2014  . Metabolic encephalopathy   . Hypernatremia 11/06/2014  . Acute renal failure superimposed on stage 3 chronic kidney disease (HCC) 11/06/2014  . UTI (urinary tract infection) 11/06/2014  . Atrial fibrillation with rapid ventricular response (HCC) 11/06/2014  . Hypercalcemia 11/06/2014  . Osteopenia 11/06/2014  . Prediabetes 11/06/2014  . GERD (gastroesophageal reflux disease) 11/06/2014  . Allergic rhinitis 11/06/2014    History reviewed. No pertinent surgical history.  Prior to Admission medications   Medication Sig Start Date End Date Taking?  Authorizing Provider  acetaminophen (TYLENOL) 325 MG tablet Take 650 mg by mouth 3 (three) times daily.    Yes [provider]  aspirin 81 MG chewable tablet Chew 81 mg by mouth every morning.   Yes [provider]  atorvastatin (LIPITOR) 10 MG tablet Take 10 mg by mouth every morning.   Yes [provider]  Calcium Carbonate-Vitamin D 600-400 MG-UNIT per tablet Take 1 tablet by mouth 2 (two) times daily.   Yes [provider]  chlorhexidine (PERIDEX) 0.12 % solution Use as directed 15 mLs in the mouth or throat at bedtime.   Yes [provider]  citalopram (CELEXA) 20 MG tablet Take 20 mg by mouth every morning.   Yes [provider]  memantine (NAMENDA) 10 MG tablet Take 10 mg by mouth 2 (two) times daily.   Yes [provider]  Multiple Vitamins-Minerals (CENTRUM SILVER PO) Take 1 tablet by mouth every morning.   Yes [provider]  polyethylene glycol (MIRALAX / GLYCOLAX) packet Take 17 g by mouth daily.   Yes [provider]  pyrithione zinc (HEAD AND SHOULDERS) 1 % shampoo Apply 1 application topically daily as needed for itching.   Yes [provider]  sodium fluoride (DENTAGEL) 1.1 % GEL dental gel Place 1 application onto teeth 2 (two) times daily.   Yes [provider]  triamcinolone cream (KENALOG) 0.1 % Apply 1 application topically 2 (two) times daily. Apply to affected areas of rash at bedtime    Yes [provider]  amoxicillin-clavulanate (AUGMENTIN) 875-125 MG tablet Take 1 tablet by mouth 2 (two) times daily. 05/07/17 05/17/17  Nita SickleVeronese, [place], MD  doxycycline (VIBRAMYCIN) 100 MG capsule Take 1 capsule (100  mg total) by mouth 2 (two) times daily. 05/07/17 05/17/17  Nita Sickle, MD  feeding supplement (BOOST HIGH PROTEIN) LIQD Take 1 Container by mouth every morning.    [provider]  guaifenesin (ROBITUSSIN) 100 MG/5ML syrup Take 200 mg by mouth 3 (three)  times daily as needed for cough or congestion.    [provider]  loperamide (IMODIUM A-D) 2 MG tablet Take 2 mg by mouth every 12 (twelve) hours as needed for diarrhea or loose stools.    [provider]  loratadine (CLARITIN) 10 MG tablet Take 10 mg by mouth daily as needed for allergies.    [provider]  omeprazole (PRILOSEC) 20 MG capsule Take 20 mg by mouth daily as needed (heartburn).    [provider]  OVER THE COUNTER MEDICATION Take 4 oz by mouth every 2 (two) hours. Encourage 4oz fluids    [provider]    Allergies Patient has no known allergies.  History reviewed. No pertinent family history.  Social History Social History  Substance Use Topics  . Smoking status: Former Games developer  . Smokeless tobacco: Never Used  . Alcohol use No    Review of Systems  Constitutional: Negative for fever. Respiratory: + shortness of breath and cough Gastrointestinal: Negative for vomiting or diarrhea.   Level 5 caveat:  Portions of the history and physical were unable to be obtained due to nonverbal and dementia  ____________________________________________   PHYSICAL EXAM:  VITAL SIGNS:  Vitals:   05/07/17 1400 05/07/17 1430  BP: 136/82 (!) 121/48  Pulse: 71 93  Resp:    Temp:    SpO2: 96% 98%   Constitutional: Awake, no distress, coughing, non verbal. HEENT:      Head: Normocephalic and atraumatic.         Eyes: Conjunctivae are normal. Sclera is non-icteric.       Mouth/Throat: Mucous membranes are moist.       Neck: Supple with no signs of meningismus. Cardiovascular: Regular rate and rhythm. No murmurs, gallops, or rubs. 2+ symmetrical distal pulses are present in all extremities. No JVD. Respiratory: Normal respiratory effort, coughing, auscultation limited to patient cooperation.  Gastrointestinal: Soft, non tender, and non distended with positive bowel sounds. No rebound or guarding. Musculoskeletal: No edema,  cyanosis, or erythema of extremities. Neurologic:  Face is symmetric. Moving all extremities. No gross focal neurologic deficits are appreciated. Skin: Skin is warm, dry and intact. No rash noted.   ____________________________________________   LABS (all labs ordered are listed, but only abnormal results are displayed)  Labs Reviewed  CBC WITH DIFFERENTIAL/PLATELET - Abnormal; Notable for the following:       Result Value   RBC 3.72 (*)    Hemoglobin 11.4 (*)    Neutro Abs 7.3 (*)    Lymphs Abs 0.8 (*)    All other components within normal limits  BASIC METABOLIC PANEL - Abnormal; Notable for the following:    Sodium 147 (*)    Glucose, Bld 139 (*)    BUN 29 (*)    Creatinine, Ser 1.42 (*)    GFR calc non Af Amer 32 (*)    GFR calc Af Amer 37 (*)    All other components within normal limits  BASIC METABOLIC PANEL - Abnormal; Notable for the following:    Glucose, Bld 149 (*)    BUN 28 (*)    Creatinine, Ser 1.35 (*)    GFR calc non Af Amer 34 (*)  GFR calc Af Amer 39 (*)    All other components within normal limits  TROPONIN I   ____________________________________________  EKG  ED ECG REPORT I, Nita Sickle, the attending physician, personally viewed and interpreted this ECG.  Normal sinus rhythm, rate of 86, prolonged QTC, normal axis, no ST elevations or depressions. Low voltage QRS. unchanged from prior from 3 days ago. ____________________________________________  RADIOLOGY  CXR:  1. Patchy left lung base opacity, unchanged, cannot exclude aspiration or pneumonia . 2. Possible trace left pleural effusion. 3. No overt pulmonary edema. ____________________________________________   PROCEDURES  Procedure(s) performed: None Procedures Critical Care performed:  None ____________________________________________   INITIAL IMPRESSION / ASSESSMENT AND PLAN / ED COURSE  83 y.o. female with a history of diastolic CHF with preserved EF in 2016,  hyperlipidemia, atrial fibrillation, chronic kidney disease who presents for evaluation of difficulty breathing and cough. patient was seen here 3 days ago for congestion with a negative chest x-ray and flu swab. Seems like her symptoms have gotten progressively worse. We'll repeat the chest x-ray at this time. Patient is afebrile and has no oxygen requirement. She was able to be taken off of oxygen upon arrival to the emergency room and satting 100% on RA with no interventions. She has normal work of breathing. Auscultation is limited due to patient's lack of cooperation. We'll check basic blood work. If chest x-ray and labs are unchanged anticipate discharge back to nursing home.  Clinical Course as of May 07 1557  Thu May 07, 2017  1551 chest x-ray concerning for pneumonia. Patient was given Rocephin and azithromycin. initial BMP showing slight hypernatremia with sodium of 147 which has normalized after IV fluids. Stable kidney function, normal white count. Patient remains afebrile in the emergency department with normal work of breathing and no oxygen requirement. I spoke with Kendal Hymen from home [facility] of [place] which is where patient resides and patient will be discharged back to her nursing home on Augmentin and doxycycline for 10 days. Discussed return precautions with Kendal Hymen for signs of increased WOB or fever and recommend return if those develop.  [CV]    Clinical Course User Index [CV] Don Perking [place], MD     As part of my medical decision making, I reviewed the following data within the electronic MEDICAL RECORD NUMBER Nursing notes reviewed and incorporated, Labs reviewed , EKG interpreted , Old EKG reviewed, Old chart reviewed, Radiograph reviewed , patient's results, plan and return precautions discussed with nurse Kendal Hymen from [facility] [place], Notes from prior ED visits and [place] Controlled Substance Database    Pertinent labs & imaging results that were available during my care of  the patient were reviewed by me and considered in my medical decision making (see chart for details).    ____________________________________________   FINAL CLINICAL IMPRESSION(S) / ED DIAGNOSES  Final diagnoses:  Community acquired pneumonia of left lower lobe of lung (HCC)      NEW MEDICATIONS STARTED DURING THIS VISIT: New Prescriptions   AMOXICILLIN-CLAVULANATE (AUGMENTIN) 875-125 MG TABLET    Take 1 tablet by mouth 2 (two) times daily.   DOXYCYCLINE (VIBRAMYCIN) 100 MG CAPSULE    Take 1 capsule (100 mg total) by mouth 2 (two) times daily.     Note:  This document was prepared using Dragon voice recognition software and may include unintentional dictation errors.    Nita Sickle, MD 05/07/17 [id number]

## 2017-05-07 NOTE — Discharge Instructions (Signed)
You were seen in the Emergency Department (ED) today and diagnosed with pneumonia. Pneumonia is an infection of the lungs. Most cases are caused by infections from bacteria or viruses.   Pneumonia may be mild or very severe. If it is caused by bacteria, you will be treated with antibiotics. It may take a few weeks to a few months to recover fully from pneumonia, depending on how sick you were and whether your overall health is good.   Stop keflex and and omnicef and take augmentin and doxycycline instead twice a day for 10 days  Follow-up with your doctor in 1-2 days for further evaluation.  When should you call for help?  Call 911 anytime you think you may need emergency care. For example, call if:  You have difficulty breathing or chest pain  Call your doctor now or seek immediate medical care if:  You cough up dark brown or bloody mucus (sputum).  You have new or worse trouble breathing.  You are dizzy or lightheaded, or you feel like you may faint.  Watch closely for changes in your health, and be sure to contact your doctor if:  You have a new or higher fever.  You are coughing more deeply or more often.  You are not getting better after 2 days (48 hours).  You do not get better as expected   How can you care for yourself at home?  Take your antibiotics exactly as directed. Do not stop taking the medicine just because you are feeling better. You need to take the full course of antibiotics.  Take your medicines exactly as prescribed. Call your doctor if you think you are having a problem with your medicine.  Get plenty of rest and sleep. You may feel weak and tired for a while, but your energy level will improve with time.  To prevent dehydration, drink plenty of fluids, enough so that your urine is light yellow or clear like water. Choose water and other caffeine-free clear liquids until you feel better. If you have kidney, heart, or liver disease and have to limit fluids, talk with  your doctor before you increase the amount of fluids you drink.  Take care of your cough so you can rest. A cough that brings up mucus from your lungs is common with pneumonia. It is one way your body gets rid of the infection. But if coughing keeps you from resting or causes severe fatigue and chest-wall pain, talk to your doctor. He or she may suggest that you take a medicine to reduce the cough.  Use a vaporizer or humidifier to add moisture to your bedroom. Follow the directions for cleaning the machine.  Do not smoke or allow others to smoke around you. Smoke will make your cough last longer. If you need help quitting, talk to your doctor about stop-smoking programs and medicines. These can increase your chances of quitting for good.  Take an over-the-counter pain medicine, such as acetaminophen (Tylenol), ibuprofen (Advil, Motrin), or naproxen (Aleve). Read and follow all instructions on the label.  Do not take two or more pain medicines at the same time unless the doctor told you to. Many pain medicines have acetaminophen, which is Tylenol. Too much acetaminophen (Tylenol) can be harmful.  If you were given a spirometer to measure how well your lungs are working, use it as instructed. This can help your doctor tell how your recovery is going.  To prevent pneumonia in the future, talk to your doctor about  getting a flu vaccine (once a year) and a pneumococcal vaccine (one time only for most people).

## 2017-05-07 NOTE — ED Notes (Signed)
[facility]  CALLED  TO  TRANSPORT  PT  BACK TO  HOME[facility]ACE AT 4:23PM

## 2017-05-16 ENCOUNTER — Inpatient Hospital Stay: Payer: Medicare Other

## 2017-05-16 ENCOUNTER — Encounter: Payer: Self-pay | Admitting: Emergency Medicine

## 2017-05-16 ENCOUNTER — Emergency Department: Payer: Medicare Other

## 2017-05-16 ENCOUNTER — Inpatient Hospital Stay
Admission: EM | Admit: 2017-05-16 | Discharge: 2017-05-19 | DRG: 682 | Disposition: A | Discharge status: Nursing Home (Includes ICF) | Payer: Medicare Other | Attending: Internal Medicine | Admitting: Internal Medicine

## 2017-05-16 DIAGNOSIS — N179 Acute kidney failure, unspecified: Secondary | ICD-10-CM | POA: Diagnosis present

## 2017-05-16 DIAGNOSIS — Z79899 Other long term (current) drug therapy: Secondary | ICD-10-CM | POA: Diagnosis not present

## 2017-05-16 DIAGNOSIS — Z87891 Personal history of nicotine dependence: Secondary | ICD-10-CM | POA: Diagnosis not present

## 2017-05-16 DIAGNOSIS — A419 Sepsis, unspecified organism: Secondary | ICD-10-CM

## 2017-05-16 DIAGNOSIS — E860 Dehydration: Secondary | ICD-10-CM | POA: Diagnosis present

## 2017-05-16 DIAGNOSIS — Z515 Encounter for palliative care: Secondary | ICD-10-CM

## 2017-05-16 DIAGNOSIS — N183 Chronic kidney disease, stage 3 (moderate): Secondary | ICD-10-CM | POA: Diagnosis present

## 2017-05-16 DIAGNOSIS — E870 Hyperosmolality and hypernatremia: Secondary | ICD-10-CM | POA: Diagnosis present

## 2017-05-16 DIAGNOSIS — E785 Hyperlipidemia, unspecified: Secondary | ICD-10-CM | POA: Diagnosis present

## 2017-05-16 DIAGNOSIS — Z7189 Other specified counseling: Secondary | ICD-10-CM

## 2017-05-16 DIAGNOSIS — G9341 Metabolic encephalopathy: Secondary | ICD-10-CM | POA: Diagnosis present

## 2017-05-16 DIAGNOSIS — Z7982 Long term (current) use of aspirin: Secondary | ICD-10-CM | POA: Diagnosis not present

## 2017-05-16 DIAGNOSIS — Z66 Do not resuscitate: Secondary | ICD-10-CM | POA: Diagnosis present

## 2017-05-16 DIAGNOSIS — F0390 Unspecified dementia without behavioral disturbance: Secondary | ICD-10-CM | POA: Diagnosis present

## 2017-05-16 DIAGNOSIS — N390 Urinary tract infection, site not specified: Secondary | ICD-10-CM | POA: Diagnosis present

## 2017-05-16 DIAGNOSIS — K219 Gastro-esophageal reflux disease without esophagitis: Secondary | ICD-10-CM | POA: Diagnosis present

## 2017-05-16 DIAGNOSIS — E7800 Pure hypercholesterolemia, unspecified: Secondary | ICD-10-CM | POA: Diagnosis present

## 2017-05-16 DIAGNOSIS — Z7401 Bed confinement status: Secondary | ICD-10-CM

## 2017-05-16 DIAGNOSIS — I4891 Unspecified atrial fibrillation: Secondary | ICD-10-CM | POA: Diagnosis present

## 2017-05-16 DIAGNOSIS — E875 Hyperkalemia: Secondary | ICD-10-CM

## 2017-05-16 LAB — TROPONIN I: Troponin I: 0.57 ng/mL (ref <0.03)

## 2017-05-16 LAB — CBC WITH DIFFERENTIAL/PLATELET
BASOS ABS: 0 10*3/uL (ref 0–0.1)
Basophils Relative: 0 %
EOS PCT: 0 %
Eosinophils Absolute: 0 10*3/uL (ref 0–0.7)
HEMATOCRIT: 46.8 % (ref 35.0–47.0)
Hemoglobin: 14.8 g/dL (ref 12.0–16.0)
LYMPHS PCT: 12 %
Lymphs Abs: 1.6 10*3/uL (ref 1.0–3.6)
MCH: 30.1 pg (ref 26.0–34.0)
MCHC: 31.6 g/dL — AB (ref 32.0–36.0)
MCV: 95.2 fL (ref 80.0–100.0)
MONO ABS: 1.5 10*3/uL — AB (ref 0.2–0.9)
MONOS PCT: 11 %
Neutro Abs: 9.8 10*3/uL — ABNORMAL HIGH (ref 1.4–6.5)
Neutrophils Relative %: 77 %
PLATELETS: 226 10*3/uL (ref 150–440)
RBC: 4.92 MIL/uL (ref 3.80–5.20)
RDW: 15.1 % — AB (ref 11.5–14.5)
WBC: 12.9 10*3/uL — ABNORMAL HIGH (ref 3.6–11.0)

## 2017-05-16 LAB — LACTIC ACID, PLASMA
LACTIC ACID, VENOUS: 2 mmol/L — AB (ref 0.5–1.9)
Lactic Acid, Venous: 1.3 mmol/L (ref 0.5–1.9)

## 2017-05-16 LAB — COMPREHENSIVE METABOLIC PANEL
ALBUMIN: 4.2 g/dL (ref 3.5–5.0)
ALK PHOS: 75 U/L (ref 38–126)
ALT: 17 U/L (ref 14–54)
AST: 28 U/L (ref 15–41)
Anion gap: 10 (ref 5–15)
BILIRUBIN TOTAL: 1.4 mg/dL — AB (ref 0.3–1.2)
BUN: 92 mg/dL — AB (ref 6–20)
CO2: 26 mmol/L (ref 22–32)
Calcium: 10.5 mg/dL — ABNORMAL HIGH (ref 8.9–10.3)
Chloride: 127 mmol/L — ABNORMAL HIGH (ref 101–111)
Creatinine, Ser: 3.23 mg/dL — ABNORMAL HIGH (ref 0.44–1.00)
GFR calc Af Amer: 14 mL/min — ABNORMAL LOW (ref >60)
GFR calc non Af Amer: 12 mL/min — ABNORMAL LOW (ref >60)
GLUCOSE: 108 mg/dL — AB (ref 65–99)
POTASSIUM: 4 mmol/L (ref 3.5–5.1)
SODIUM: 163 mmol/L — AB (ref 135–145)
TOTAL PROTEIN: 8.5 g/dL — AB (ref 6.5–8.1)

## 2017-05-16 LAB — URINALYSIS, ROUTINE W REFLEX MICROSCOPIC
BACTERIA UA: NONE SEEN
Bilirubin Urine: NEGATIVE
Glucose, UA: NEGATIVE mg/dL
KETONES UR: NEGATIVE mg/dL
Nitrite: NEGATIVE
PROTEIN: 100 mg/dL — AB
Specific Gravity, Urine: 1.017 (ref 1.005–1.030)
pH: 5 (ref 5.0–8.0)

## 2017-05-16 LAB — INFLUENZA PANEL BY PCR (TYPE A & B)
INFLAPCR: NEGATIVE
Influenza B By PCR: NEGATIVE

## 2017-05-16 MED ORDER — HYDRALAZINE HCL 20 MG/ML IJ SOLN: 10.0000 mg | INTRAMUSCULAR | Status: DC | PRN

## 2017-05-16 MED ORDER — LORAZEPAM 2 MG/ML IJ SOLN: 0.5000 mg | INTRAMUSCULAR | Status: DC | PRN

## 2017-05-16 MED ORDER — SODIUM CHLORIDE 0.45 % IV SOLN
INTRAVENOUS | Status: DC
  Administered 2017-05-17: 04:00:00 via INTRAVENOUS

## 2017-05-16 MED ORDER — VANCOMYCIN HCL IN DEXTROSE 1-5 GM/200ML-% IV SOLN
1000.0000 mg | INTRAVENOUS | Status: DC
  Administered 2017-05-17: 1000 mg via INTRAVENOUS
  Filled 2017-05-16: qty 200

## 2017-05-16 MED ORDER — BISACODYL 10 MG RE SUPP: 10.0000 mg | Freq: Every day | RECTAL | Status: DC | PRN

## 2017-05-16 MED ORDER — MORPHINE SULFATE (PF) 2 MG/ML IV SOLN: 2.0000 mg | INTRAVENOUS | Status: DC | PRN

## 2017-05-16 MED ORDER — ACETAMINOPHEN 650 MG RE SUPP
RECTAL | Status: AC
  Administered 2017-05-16: 650 mg via RECTAL
  Filled 2017-05-16: qty 1

## 2017-05-16 MED ORDER — ONDANSETRON HCL 4 MG/2ML IJ SOLN: 4.0000 mg | Freq: Four times a day (QID) | INTRAMUSCULAR | Status: DC | PRN

## 2017-05-16 MED ORDER — VANCOMYCIN HCL IN DEXTROSE 1-5 GM/200ML-% IV SOLN
1000.0000 mg | Freq: Once | INTRAVENOUS | Status: AC
  Administered 2017-05-16: 1000 mg via INTRAVENOUS
  Filled 2017-05-16: qty 200

## 2017-05-16 MED ORDER — ACETAMINOPHEN 650 MG RE SUPP
650.0000 mg | Freq: Four times a day (QID) | RECTAL | Status: DC | PRN
Start: 2017-05-16 — End: 2017-05-19

## 2017-05-16 MED ORDER — MEMANTINE HCL 10 MG PO TABS
10.0000 mg | ORAL_TABLET | Freq: Two times a day (BID) | ORAL | Status: DC
  Administered 2017-05-17 – 2017-05-19 (×5): 10 mg via ORAL
  Filled 2017-05-16 (×7): qty 1

## 2017-05-16 MED ORDER — PANTOPRAZOLE SODIUM 40 MG IV SOLR
40.0000 mg | Freq: Two times a day (BID) | INTRAVENOUS | Status: DC
  Administered 2017-05-16 – 2017-05-17 (×3): 40 mg via INTRAVENOUS
  Filled 2017-05-16 (×3): qty 40

## 2017-05-16 MED ORDER — HEPARIN SODIUM (PORCINE) 5000 UNIT/ML IJ SOLN
5000.0000 [IU] | Freq: Three times a day (TID) | INTRAMUSCULAR | Status: DC
  Administered 2017-05-17 – 2017-05-19 (×6): 5000 [IU] via SUBCUTANEOUS
  Filled 2017-05-16 (×7): qty 1

## 2017-05-16 MED ORDER — IPRATROPIUM-ALBUTEROL 0.5-2.5 (3) MG/3ML IN SOLN
3.0000 mL | Freq: Four times a day (QID) | RESPIRATORY_TRACT | Status: DC
  Administered 2017-05-16 – 2017-05-17 (×3): 3 mL via RESPIRATORY_TRACT
  Filled 2017-05-16 (×2): qty 3

## 2017-05-16 MED ORDER — CITALOPRAM HYDROBROMIDE 20 MG PO TABS
20.0000 mg | ORAL_TABLET | ORAL | Status: DC
  Administered 2017-05-17 – 2017-05-19 (×3): 20 mg via ORAL
  Filled 2017-05-16 (×3): qty 1

## 2017-05-16 MED ORDER — PIPERACILLIN-TAZOBACTAM 3.375 G IVPB 30 MIN
3.3750 g | Freq: Once | INTRAVENOUS | Status: AC
  Administered 2017-05-16: 3.375 g via INTRAVENOUS
  Filled 2017-05-16: qty 50

## 2017-05-16 MED ORDER — SODIUM CHLORIDE 0.9 % IV BOLUS (SEPSIS)
1000.0000 mL | Freq: Once | INTRAVENOUS | Status: AC
  Administered 2017-05-16: 1000 mL via INTRAVENOUS

## 2017-05-16 MED ORDER — ACETAMINOPHEN 650 MG RE SUPP
650.0000 mg | Freq: Once | RECTAL | Status: AC
  Administered 2017-05-16: 650 mg via RECTAL

## 2017-05-16 MED ORDER — LORATADINE 10 MG PO TABS: 10.0000 mg | ORAL_TABLET | Freq: Every day | ORAL | Status: DC | PRN

## 2017-05-16 MED ORDER — BOOST HIGH PROTEIN PO LIQD
1.0000 | ORAL | Status: DC
  Administered 2017-05-17 – 2017-05-18 (×2): 237 mL via ORAL
  Filled 2017-05-16: qty 237

## 2017-05-16 MED ORDER — PIPERACILLIN-TAZOBACTAM 3.375 G IVPB
3.3750 g | Freq: Two times a day (BID) | INTRAVENOUS | Status: DC
  Administered 2017-05-17 (×2): 3.375 g via INTRAVENOUS
  Filled 2017-05-16 (×2): qty 50

## 2017-05-16 MED ORDER — DOCUSATE SODIUM 100 MG PO CAPS
100.0000 mg | ORAL_CAPSULE | Freq: Two times a day (BID) | ORAL | Status: DC
  Administered 2017-05-17 – 2017-05-18 (×4): 100 mg via ORAL
  Filled 2017-05-16 (×6): qty 1

## 2017-05-16 MED ORDER — ASPIRIN 81 MG PO CHEW
81.0000 mg | CHEWABLE_TABLET | ORAL | Status: DC
  Administered 2017-05-17 – 2017-05-19 (×3): 81 mg via ORAL
  Filled 2017-05-16 (×3): qty 1

## 2017-05-16 MED ORDER — ONDANSETRON HCL 4 MG PO TABS: 4.0000 mg | ORAL_TABLET | Freq: Four times a day (QID) | ORAL | Status: DC | PRN

## 2017-05-16 MED ORDER — ACETAMINOPHEN 325 MG PO TABS: 650.0000 mg | ORAL_TABLET | Freq: Four times a day (QID) | ORAL | Status: DC | PRN

## 2017-05-16 NOTE — ED Notes (Signed)
[facility] [facility] [place] to update on [facility]'s status.

## 2017-05-16 NOTE — ED Notes (Addendum)
Date and time results received: 05/16/17 1535 (use smartphrase ".now" to insert current time)  Test: troponin I 0.57, sodium 163, lactic acid 2.0  Name of Provider Notified: Dr. Alphonzo LemmingsMcShane  Orders Received? Or Actions Taken?: Orders Received - See Orders for details

## 2017-05-16 NOTE — Clinical Social Work Note (Signed)
Clinical Social Work Assessment  Patient Details  Name: Molly Jimenez MRN: [id number] Date of Birth: 06/22/1926  Date of referral:  05/16/17               Reason for consult:  Other (Comment Required) (From a Home Place)                Permission sought to share information with:  Facility Medical sales representativeContact Representative, Guardian Permission granted to share information::     Name::     Donney RankinsRheesha Carr [phone]  Agency::  [facility]  Relationship::  DSS Saintclair HalstedRhesha Carr [phone]  Contact Information:     Housing/Transportation Living arrangements for the past 2 months:    Source of Information:  Medical Team Patient Interpreter Needed:  None Criminal Activity/Legal Involvement Pertinent to Current Situation/Hospitalization:  Yes Significant Relationships:  Phel[employer] DodgeCommunity Support Lives with:  Facility Resident Do you feel safe going back to the place where you live?  Yes Need for family participation in patient care:  Yes (Comment)  Care giving concerns:  TBD- Awaiting call back from Patient guardian/    Social Worker assessment / plan:  LCSW wanted to assess patient [facility] and called and left messages for both guardian Michail SermonReisha Carr and [facility] staff and awaiting call back. Patient is non verbal and unable to assess at this.  Employment status:  Retired Health and safety inspectornsurance information:  Tree surgeonMedicare (UHC/Medicare) PT Recommendations:  Not assessed at this time Information / Referral to community resources:     Patient/Family's Response to care:  TBD  Patient/Family's Understanding of and Emotional Response to Diagnosis, Current Treatment, and Prognosis:  TBD  Emotional Assessment Appearance:  Appears stated age Attitude/Demeanor/Rapport:  Unable to Assess Affect (typically observed):  Unable to Assess Orientation:   (Non verbal) Alcohol / Substance use:    Psych involvement (Current and /or in the community):  No (Comment)  Discharge Needs  Concerns to be addressed:  No discharge needs  identified Readmission within the last 30 days:  No Current discharge risk:  Cognitively Impaired Barriers to Discharge:  Continued Medical Work up   Cheron SchaumannBandi, Marcayla Budge M, LCSW 05/16/2017, 5:06 PM

## 2017-05-16 NOTE — Progress Notes (Signed)
CODE SEPSIS - PHARMACY COMMUNICATION  **Broad Spectrum Antibiotics should be administered within 1 hour of Sepsis diagnosis**  Time Code Sepsis Called/Page Received: 1441  Antibiotics Ordered: vancomycin and zosyn  Time of 1st antibiotic administration: 1501  Additional action taken by pharmacy: none  If necessary, Name of Provider/Nurse Contacted: none    Molly Jimenez ,PharmD Clinical Pharmacist  05/16/2017  3:18 PM

## 2017-05-16 NOTE — Progress Notes (Signed)
Pharmacy Antibiotic Note  Suzzanne E Matas is a 83 y.o. femaleFreda Jackson admitted on 05/16/2017 with sepsis.  Pharmacy has been consulted for zosyn and vancomycin dosing.  Plan: Vancomycin 1000mg  IV every 48h hours.  Goal trough 15-20 mcg/mL. Zosyn 3.375g IV q12h (4 hour infusion). Start vancomycin 12 hours after first 1gm dose, trough before 3rd level 11/8@0230     Ke 0.017 T1/2: 41hrs    Height: 5\' 6"  (167.6 cm) Weight: 150 lb (68 kg) IBW/kg (Calculated) : 59.3  Temp (24hrs), Avg:101.3 F (38.5 C), Min:101.3 F (38.5 C), Max:101.3 F (38.5 C)   Recent Labs Lab 05/16/17 1433 05/16/17 1633  WBC 12.9*  --   CREATININE 3.23*  --   LATICACIDVEN  --  2.0*    Estimated Creatinine Clearance: 11.1 mL/min (A) (by C-G formula based on SCr of 3.23 mg/dL (H)).    No Known Allergies  Antimicrobials this admission: Anti-infectives    Start     Dose/Rate Route Frequency Ordered Stop   05/17/17 0300  vancomycin (VANCOCIN) IVPB 1000 mg/200 mL premix     1,000 mg 200 mL/hr over 60 Minutes Intravenous Every 48 hours 05/16/17 1612     05/16/17 1445  piperacillin-tazobactam (ZOSYN) IVPB 3.375 g     3.375 g 100 mL/hr over 30 Minutes Intravenous  Once 05/16/17 1437 05/16/17 1531   05/16/17 1445  vancomycin (VANCOCIN) IVPB 1000 mg/200 mL premix     1,000 mg 200 mL/hr over 60 Minutes Intravenous  Once 05/16/17 1437        Microbiology results: No results found for this or any previous visit (from the past 240 hour(s)).   Thank you for allowing pharmacy to be a part of this patient's care.  Gerre PebblesGarrett Chelcea Zahn 05/16/2017 4:13 PM

## 2017-05-16 NOTE — Progress Notes (Signed)
LCSW called both Donney Rankinsheesha Carr and [facility] to obtain information to complete assessment and fl2. Left messages and will resume on Sunday Nov 4th/2018   Swiftonlaudine Breyton Vanscyoc LCSW [phone]

## 2017-05-16 NOTE — ED Provider Notes (Signed)
[facility] Emergency Department Provider Note  ____________________________________________   First MD Initiated Contact with Patient 05/16/17 1425     (approximate)  I have reviewed the triage vital signs and the nursing notes.   HISTORY  Chief Complaint Abnormal Lab and Code Sepsis   HPI Molly Jimenez is a 83 y.o. female who is nonverbal at her baseline who is being sent to the emergency department today for elevated sodium.  Per EMS, she was also found to be febrile at 101 degrees axillary.  Patient is unable to give any further history due to her baseline nonverbal status.  EMS reports that the patient is in an assisted living although she does appear to be nonambulatory.  Patient with DNR at the bedside.    Past Medical History:  Diagnosis Date  . GERD (gastroesophageal reflux disease)   . Hypercholesteremia   . Renal disorder     Patient Active Problem List   Diagnosis Date Noted  . Secondary hyperparathyroidism of renal origin (HCC) 11/10/2014  . Bradycardia   . Diastolic dysfunction 11/08/2014  . Subclinical hyperthyroidism 11/08/2014  . Polyclonal gammopathy determined by serum protein electrophoresis 11/08/2014  . Metabolic encephalopathy   . Hypernatremia 11/06/2014  . Acute renal failure superimposed on stage 3 chronic kidney disease (HCC) 11/06/2014  . UTI (urinary tract infection) 11/06/2014  . Atrial fibrillation with rapid ventricular response (HCC) 11/06/2014  . Hypercalcemia 11/06/2014  . Osteopenia 11/06/2014  . Prediabetes 11/06/2014  . GERD (gastroesophageal reflux disease) 11/06/2014  . Allergic rhinitis 11/06/2014    History reviewed. No pertinent surgical history.  Prior to Admission medications   Medication Sig Start Date End Date Taking? Authorizing Provider  acetaminophen (TYLENOL) 325 MG tablet Take 650 mg by mouth 3 (three) times daily.     [provider]  amoxicillin-clavulanate (AUGMENTIN)  875-125 MG tablet Take 1 tablet by mouth 2 (two) times daily. 05/07/17 05/17/17  Nita Sickle, MD  aspirin 81 MG chewable tablet Chew 81 mg by mouth every morning.    [provider]  atorvastatin (LIPITOR) 10 MG tablet Take 10 mg by mouth every morning.    [provider]  Calcium Carbonate-Vitamin D 600-400 MG-UNIT per tablet Take 1 tablet by mouth 2 (two) times daily.    [provider]  chlorhexidine (PERIDEX) 0.12 % solution Use as directed 15 mLs in the mouth or throat at bedtime.    [provider]  citalopram (CELEXA) 20 MG tablet Take 20 mg by mouth every morning.    [provider]  doxycycline (VIBRAMYCIN) 100 MG capsule Take 1 capsule (100 mg total) by mouth 2 (two) times daily. 05/07/17 05/17/17  Nita Sickle, MD  feeding supplement (BOOST HIGH PROTEIN) LIQD Take 1 Container by mouth every morning.    [provider]  guaifenesin (ROBITUSSIN) 100 MG/5ML syrup Take 200 mg by mouth 3 (three) times daily as needed for cough or congestion.    [provider]  loperamide (IMODIUM A-D) 2 MG tablet Take 2 mg by mouth every 12 (twelve) hours as needed for diarrhea or loose stools.    [provider]  loratadine (CLARITIN) 10 MG tablet Take 10 mg by mouth daily as needed for allergies.    [provider]  memantine (NAMENDA) 10 MG tablet Take 10 mg by mouth 2 (two) times daily.    [provider]  Multiple Vitamins-Minerals (CENTRUM SILVER PO) Take 1 tablet by mouth every morning.    [provider]  omeprazole (PRILOSEC) 20 MG capsule Take 20 mg by mouth daily as needed (heartburn).    [provider]  OVER THE COUNTER MEDICATION Take 4 oz by mouth every 2 (two) hours. Encourage 4oz fluids    [provider]  polyethylene glycol (MIRALAX / GLYCOLAX) packet Take 17 g by mouth daily.    [provider]  pyrithione zinc (HEAD AND SHOULDERS) 1 % shampoo Apply 1  application topically daily as needed for itching.    [provider]  sodium fluoride (DENTAGEL) 1.1 % GEL dental gel Place 1 application onto teeth 2 (two) times daily.    [provider]  triamcinolone cream (KENALOG) 0.1 % Apply 1 application topically 2 (two) times daily. Apply to affected areas of rash at bedtime     [provider]    Allergies Patient has no known allergies.  History reviewed. No pertinent family history.  Social History Social History  Substance Use Topics  . Smoking status: Former Games developer  . Smokeless tobacco: Never Used  . Alcohol use No    Review of Systems  Level 5 caveat secondary to nonverbal   ____________________________________________   PHYSICAL EXAM:  VITAL SIGNS: ED Triage Vitals  Enc Vitals Group     BP      Pulse      Resp      Temp      Temp src      SpO2      Weight      Height      Head Circumference      Peak Flow      Pain Score      Pain Loc      Pain Edu?      Excl. in GC?     Constitutional: Alert but nonverbal. Eyes: Conjunctivae are normal.  Head: Atraumatic. Nose: No congestion/rhinnorhea. Mouth/Throat: Mucous membranes are dry Neck: No stridor.   Cardiovascular: Tachycardic, regular rhythm. Grossly normal heart sounds.  Respiratory: Normal respiratory effort.  No retractions. Lungs CTAB. Gastrointestinal: Soft and nontender. No distention.  Musculoskeletal: No lower extremity tenderness nor edema.  No joint effusions. Neurologic:   No gross focal neurologic deficits are appreciated.  Appears to move all extremities equally. Skin:  Skin is warm, dry and intact.  Scaling lesion noted to the right lateral thigh.  Possible fungal infection.   ____________________________________________   LABS (all labs ordered are listed, but only abnormal results are displayed)  Labs Reviewed  CULTURE, BLOOD (ROUTINE X 2)  CULTURE, BLOOD (ROUTINE X 2)  URINE CULTURE  COMPREHENSIVE  METABOLIC PANEL  CBC WITH DIFFERENTIAL/PLATELET  URINALYSIS, ROUTINE W REFLEX MICROSCOPIC  LACTIC ACID, PLASMA  LACTIC ACID, PLASMA  TROPONIN I  INFLUENZA PANEL BY PCR (TYPE A & B)   ____________________________________________  EKG  ED ECG REPORT I, Arelia Longest, the attending physician, personally viewed and interpreted this ECG.   Date: 05/16/2017  EKG Time: 1445  Rate: 115  Rhythm: Sinus tachycardia with PVC x1  Axis: Normal  Intervals:none  ST&T Change: Minimal ST elevation in aVR with diffuse and minimal ST depressions less than 1 mm.  No abnormal T wave inversions.  ____________________________________________  RADIOLOGY  Pending cxr ____________________________________________   PROCEDURES  Procedure(s) performed:   Procedures  Critical Care performed:  ____________________________________________   INITIAL IMPRESSION / ASSESSMENT AND PLAN / ED COURSE  Pertinent labs & imaging results that were available during my care of the patient were reviewed by me  and considered in my medical decision making (see chart for details).  DDX: Electrolyte abnormality, sepsis, fever, UTI, pneumonia, bacteremia  As part of my medical decision making, I reviewed the following data within the electronic MEDICAL RECORD NUMBER Old chart reviewed  ----------------------------------------- 3:14 PM on 05/16/2017 -----------------------------------------  Patient at this time pending labs and imaging.  Signed out to Dr. Alphonzo LemmingsMcShane.  Sepsis alert called and patient given broad-spectrum antibiotics as well as fluids.      ____________________________________________   FINAL CLINICAL IMPRESSION(S) / ED DIAGNOSES  sepsis   NEW MEDICATIONS STARTED DURING THIS VISIT:  New Prescriptions   No medications on file     Note:  This document was prepared using Dragon voice recognition software and may include unintentional dictation errors.     Myrna BlazerSchaevitz, Shakia Sebastiano Matthew,  MD 05/16/17 [phone]

## 2017-05-16 NOTE — ED Triage Notes (Signed)
Pt presents to ED via [facility] from [facility] c/o hypernatremia, SNF state their MD instructed them to send pt out. EMS report unsure what sodium level actually was. Pt noted to be febrile and tachycardic. Seen 1 wk ago for nasal congestion/rhonchi. EMS report they have transported this patient multiple times in the past and she is always bedbound and non-verbal at baseline.

## 2017-05-16 NOTE — H&P (Signed)
History and Physical    Molly Jimenez:[id number] DOB: [birth year] DOA: 05/16/2017  Referring physician: Dr. Alphonzo Lemmings PCP: System, Pcp Not In  Specialists: none  Chief Complaint: "high sodium"  HPI: Molly Jimenez is a 83 y.o. female has a past medical history significant for CKD, HLD, and dementia who is chronically bedbound and non-verbal sent from ALF to ER for hypernatremia. Pt has had cough and congestion x 1 week. She is unable to provide hx and no family present. Hx comes from phone call to ALF. In ER, pt noted to be febrile and tachycardic with dehydration, UTI, and AKI. She is non-verbal. She is now admitted. She is a DNR.  Review of Systems: unable to obtain due to non-verbal status  Past Medical History:  Diagnosis Date  . GERD (gastroesophageal reflux disease)   . Hypercholesteremia   . Renal disorder    History reviewed. No pertinent surgical history. Social History:  reports that she has quit smoking. She has never used smokeless tobacco. She reports that she does not drink alcohol or use drugs.  No Known Allergies  History reviewed. No pertinent family history.  Prior to Admission medications   Medication Sig Start Date End Date Taking? Authorizing Provider  acetaminophen (TYLENOL) 325 MG tablet Take 650 mg by mouth 3 (three) times daily.     [provider]  amoxicillin-clavulanate (AUGMENTIN) 875-125 MG tablet Take 1 tablet by mouth 2 (two) times daily. 05/07/17 05/17/17  Molly Sickle, MD  aspirin 81 MG chewable tablet Chew 81 mg by mouth every morning.    [provider]  atorvastatin (LIPITOR) 10 MG tablet Take 10 mg by mouth every morning.    [provider]  Calcium Carbonate-Vitamin D 600-400 MG-UNIT per tablet Take 1 tablet by mouth 2 (two) times daily.    [provider]  chlorhexidine (PERIDEX) 0.12 % solution Use as directed 15 mLs in the mouth or throat at bedtime.    [provider]  citalopram  (CELEXA) 20 MG tablet Take 20 mg by mouth every morning.    [provider]  doxycycline (VIBRAMYCIN) 100 MG capsule Take 1 capsule (100 mg total) by mouth 2 (two) times daily. 05/07/17 05/17/17  Molly Sickle, MD  feeding supplement (BOOST HIGH PROTEIN) LIQD Take 1 Container by mouth every morning.    [provider]  guaifenesin (ROBITUSSIN) 100 MG/5ML syrup Take 200 mg by mouth 3 (three) times daily as needed for cough or congestion.    [provider]  loperamide (IMODIUM A-D) 2 MG tablet Take 2 mg by mouth every 12 (twelve) hours as needed for diarrhea or loose stools.    [provider]  loratadine (CLARITIN) 10 MG tablet Take 10 mg by mouth daily as needed for allergies.    [provider]  memantine (NAMENDA) 10 MG tablet Take 10 mg by mouth 2 (two) times daily.    [provider]  Multiple Vitamins-Minerals (CENTRUM SILVER PO) Take 1 tablet by mouth every morning.    [provider]  omeprazole (PRILOSEC) 20 MG capsule Take 20 mg by mouth daily as needed (heartburn).    [provider]  OVER THE COUNTER MEDICATION Take 4 oz by mouth every 2 (two) hours. Encourage 4oz fluids    [provider]  polyethylene glycol (MIRALAX / GLYCOLAX) packet Take 17 g by mouth daily.    [provider]  pyrithione zinc (HEAD AND SHOULDERS) 1 % shampoo Apply 1 application topically daily  as needed for itching.    [provider]  sodium fluoride (DENTAGEL) 1.1 % GEL dental gel Place 1 application onto teeth 2 (two) times daily.    [provider]  triamcinolone cream (KENALOG) 0.1 % Apply 1 application topically 2 (two) times daily. Apply to affected areas of rash at bedtime     [provider]   Physical Exam: Vitals:   05/16/17 1453  BP: (!) 128/98  Pulse: (!) 119  Resp: 20  Temp: (!) 101.3 F (38.5 C)  TempSrc: Rectal  SpO2: 98%  Weight: 68 kg (150 lb)  Height: 5\' 6"  (1.676 m)      General:  Lethargic, cachectic, [place]/AT  Eyes: PERRL, EOMI, no scleral icterus, conjunctiva clear  ENT: dry oropharynx without exudate, TM's benign, dentition poor  Neck: supple, no lymphadenopathy. No thyromegaly. Bilateral bruits noted  Cardiovascular: rapid rate regular rhythm without MRG; 2+ peripheral pulses, no JVD, no peripheral edema  Respiratory: scattered rhonchi, good air movement without wheezing or crackles. Respiratory effort normal  Abdomen: soft, non tender to palpation, positive bowel sounds, no guarding, no rebound  Skin: no rashes or lesions  Musculoskeletal: normal bulk and tone, no joint swelling  Psychiatric: non-verbal  Neurologic: CN 2-12 grossly intact, Motor strength 4/5 in all 4 groups with symmetric DTR's and non-focal sensory exam  Labs on Admission:  Basic Metabolic Panel:  Recent Labs Lab 05/16/17 1433  NA 163*  K 4.0  CL 127*  CO2 26  GLUCOSE 108*  BUN 92*  CREATININE 3.23*  CALCIUM 10.5*   Liver Function Tests:  Recent Labs Lab 05/16/17 1433  AST 28  ALT 17  ALKPHOS 75  BILITOT 1.4*  PROT 8.5*  ALBUMIN 4.2   No results for input(s): LIPASE, AMYLASE in the last 168 hours. No results for input(s): AMMONIA in the last 168 hours. CBC:  Recent Labs Lab 05/16/17 1433  WBC 12.9*  NEUTROABS 9.8*  HGB 14.8  HCT 46.8  MCV 95.2  PLT 226   Cardiac Enzymes:  Recent Labs Lab 05/16/17 1433  TROPONINI 0.57*    BNP (last 3 results) No results for input(s): BNP in the last 8760 hours.  ProBNP (last 3 results) No results for input(s): PROBNP in the last 8760 hours.  CBG: No results for input(s): GLUCAP in the last 168 hours.  Radiological Exams on Admission: Dg Chest Port 1 View  Result Date: 05/16/2017 CLINICAL DATA:  83 year old female with a history of fever EXAM: PORTABLE CHEST 1 VIEW COMPARISON:  05/07/2017 FINDINGS: Cardiomediastinal silhouette unchanged in size and contour. Calcifications of the aorta. No  evidence of central vascular congestion. Coarsened interstitial markings similar prior. No confluent airspace disease, pneumothorax, or pleural effusion. Changes of vertebroplasty. No displaced fracture IMPRESSION: Chronic lung changes without evidence of superimposed acute cardiopulmonary disease Electronically Signed   By: Gilmer MorJaime  Wagner D.O.   On: 05/16/2017 15:14    EKG: Independently reviewed.  Assessment/Plan Principal Problem:   AKI (acute kidney injury) (HCC) Active Problems:   Hypernatremia   UTI (urinary tract infection)   Metabolic encephalopathy   Will admit to floor as DNR with IV fluids and IV ABX. Cultures sent. Will consult Palliative Care, PT, and CSW. Repeat labs in [place]. Prognosis poor  Diet: soft Fluids: 1/2 NS@100  DVT Prophylaxis: SQ Heparin  Code Status: DNR  Family Communication: none  Disposition Plan: SNF vs Hospice  Time spent: 50 min

## 2017-05-16 NOTE — ED Notes (Signed)
Pt noted to have period of afib/RVR lasting only a few seconds, HR up to 195, rhythm strip printed. MD Sparks aware. Pt being changed from ortho floor to cardiac. HR currently 94/afib.

## 2017-05-16 NOTE — Progress Notes (Signed)
Patient in no distress. Vitals stable on room air. svn given. Patient not able to perform IS

## 2017-05-17 LAB — CBC
HCT: 46.5 % (ref 35.0–47.0)
HEMOGLOBIN: 14.4 g/dL (ref 12.0–16.0)
MCH: 30.3 pg (ref 26.0–34.0)
MCHC: 30.8 g/dL — AB (ref 32.0–36.0)
MCV: 98.3 fL (ref 80.0–100.0)
PLATELETS: 146 10*3/uL — AB (ref 150–440)
RBC: 4.74 MIL/uL (ref 3.80–5.20)
RDW: 16.4 % — ABNORMAL HIGH (ref 11.5–14.5)
WBC: 11.3 10*3/uL — AB (ref 3.6–11.0)

## 2017-05-17 LAB — COMPREHENSIVE METABOLIC PANEL
ALT: 14 U/L (ref 14–54)
ANION GAP: 9 (ref 5–15)
AST: 29 U/L (ref 15–41)
Albumin: 3.6 g/dL (ref 3.5–5.0)
Alkaline Phosphatase: 74 U/L (ref 38–126)
BILIRUBIN TOTAL: 1.4 mg/dL — AB (ref 0.3–1.2)
BUN: 76 mg/dL — AB (ref 6–20)
CO2: 26 mmol/L (ref 22–32)
Calcium: 9.3 mg/dL (ref 8.9–10.3)
Chloride: 128 mmol/L — ABNORMAL HIGH (ref 101–111)
Creatinine, Ser: 2.26 mg/dL — ABNORMAL HIGH (ref 0.44–1.00)
GFR, EST AFRICAN AMERICAN: 21 mL/min — AB (ref >60)
GFR, EST NON AFRICAN AMERICAN: 18 mL/min — AB (ref >60)
Glucose, Bld: 122 mg/dL — ABNORMAL HIGH (ref 65–99)
POTASSIUM: 3.3 mmol/L — AB (ref 3.5–5.1)
Sodium: 163 mmol/L (ref 135–145)
TOTAL PROTEIN: 7.3 g/dL (ref 6.5–8.1)

## 2017-05-17 LAB — MRSA PCR SCREENING: MRSA by PCR: NEGATIVE

## 2017-05-17 MED ORDER — DEXTROSE 5 % IV SOLN
1.0000 g | INTRAVENOUS | Status: DC
  Administered 2017-05-17: 1 g via INTRAVENOUS
  Filled 2017-05-17 (×2): qty 10

## 2017-05-17 MED ORDER — DEXTROSE 5 % IV SOLN
INTRAVENOUS | Status: AC
  Administered 2017-05-17 (×2): via INTRAVENOUS

## 2017-05-17 MED ORDER — IPRATROPIUM-ALBUTEROL 0.5-2.5 (3) MG/3ML IN SOLN: 3.0000 mL | RESPIRATORY_TRACT | Status: DC | PRN

## 2017-05-17 NOTE — Progress Notes (Signed)
[facility] [facility] [place] [facility]                                                                                                                                                                                  Patient Demographics   Molly LitterGrace Jimenez, is a 83 y.o. female, DOB - [birth year], WUJ:[id number]  Admit date - 05/16/2017   Admitting Physician Marguarite ArbourJeffrey D Sparks, MD  Outpatient Primary MD for the patient is System, Pcp Not In   LOS - 1  Subjective: Patient admitted with hyper natremia Patient normally bedbound and has advance dementia  Review of Systems:   CONSTITUTIONAL: Unable to provide  Vitals:   Vitals:   05/17/17 0457 05/17/17 0749 05/17/17 0811 05/17/17 1215  BP: (!) 123/46  132/77 (!) 121/51  Pulse: 88  84 80  Resp: 18  18   Temp: (!) 97.4 F (36.3 C)  97.9 F (36.6 C) 98.3 F (36.8 C)  TempSrc: Oral   Oral  SpO2: 93% 94% 95% 98%  Weight: 128 lb 6.4 oz (58.2 kg)     Height:        Wt Readings from Last 3 Encounters:  05/17/17 128 lb 6.4 oz (58.2 kg)  05/07/17 150 lb (68 kg)  05/04/17 150 lb (68 kg)     Intake/Output Summary (Last 24 hours) at 05/17/2017 1408 Last data filed at 05/17/2017 1043 Gross per 24 hour  Intake 2250 ml  Output -  Net 2250 ml    Physical Exam:   GENERAL: Chronically ill-appearing HEAD, EYES, EARS, NOSE AND THROAT: Atraumatic, normocephalic. . Pupils equal and reactive to light. Sclerae anicteric. No conjunctival injection. No oro-pharyngeal erythema.  NECK: Supple. There is no jugular venous distention. No bruits, no lymphadenopathy, no thyromegaly.  HEART: Regular rate and rhythm,. No murmurs, no rubs, no clicks.  LUNGS: Clear to auscultation bilaterally. No rales or rhonchi. No wheezes.  ABDOMEN: Soft, flat, nontender, nondistended. Has good bowel sounds. No hepatosplenomegaly appreciated.  EXTREMITIES: No evidence of any cyanosis, clubbing, or peripheral edema.  +2 pedal and radial pulses bilaterally.   NEUROLOGIC: The patient is awake but does not answer any questions SKIN: Moist and warm with no rashes appreciated.  Psych: Not anxious, depressed LN: No inguinal LN enlargement    Antibiotics   Anti-infectives (From admission, onward)   Start     Dose/Rate Route Frequency Ordered Stop   05/17/17 0300  vancomycin (VANCOCIN) IVPB 1000 mg/200 mL premix     1,000 mg 200 mL/hr over 60 Minutes Intravenous Every 48 hours 05/16/17 1612     05/17/17 0200  piperacillin-tazobactam (ZOSYN) IVPB 3.375 g     3.375 g 12.5  mL/hr over 240 Minutes Intravenous Every 12 hours 05/16/17 1615     05/16/17 1445  piperacillin-tazobactam (ZOSYN) IVPB 3.375 g     3.375 g 100 mL/hr over 30 Minutes Intravenous  Once 05/16/17 1437 05/16/17 1531   05/16/17 1445  vancomycin (VANCOCIN) IVPB 1000 mg/200 mL premix     1,000 mg 200 mL/hr over 60 Minutes Intravenous  Once 05/16/17 1437 05/16/17 1745      Medications   Scheduled Meds: . aspirin  81 mg Oral BH-q7a  . citalopram  20 mg Oral BH-q7a  . docusate sodium  100 mg Oral BID  . feeding supplement  1 Container Oral BH-q7a  . heparin  5,000 Units Subcutaneous Q8H  . memantine  10 mg Oral BID  . pantoprazole (PROTONIX) IV  40 mg Intravenous Q12H   Continuous Infusions: . dextrose 110 mL/hr at 05/17/17 0659  . piperacillin-tazobactam (ZOSYN)  IV 3.375 g (05/17/17 1359)  . vancomycin Stopped (05/17/17 0409)   PRN Meds:.acetaminophen **OR** acetaminophen, bisacodyl, hydrALAZINE, ipratropium-albuterol, loratadine, LORazepam, morphine injection, ondansetron **OR** ondansetron (ZOFRAN) IV   Data Review:   Micro Results Recent Results (from the past 240 hour(s))  Blood Culture (routine x 2)     Status: None (Preliminary result)   Collection Time: 05/16/17  2:33 PM  Result Value Ref Range Status   Specimen Description BLOOD Blood Culture adequate volume  Final   Special Requests   Final    BOTTLES DRAWN AEROBIC AND ANAEROBIC BLOOD LEFT FOREARM    Culture NO GROWTH < 24 HOURS  Final   Report Status PENDING  Incomplete  Blood Culture (routine x 2)     Status: None (Preliminary result)   Collection Time: 05/16/17  2:38 PM  Result Value Ref Range Status   Specimen Description   Final    BLOOD Blood Culture results may not be optimal due to an excessive volume of blood received in culture bottles   Special Requests   Final    BOTTLES DRAWN AEROBIC AND ANAEROBIC BLOOD RIGHT FOREARM   Culture NO GROWTH < 24 HOURS  Final   Report Status PENDING  Incomplete    Radiology Reports Dg Chest 2 View  Result Date: 05/04/2017 CLINICAL DATA:  Cough. EXAM: CHEST  2 VIEW COMPARISON:  Radiograph of April 18, 2017. FINDINGS: Stable cardiomediastinal silhouette. Atherosclerosis of thoracic aorta is noted. No pneumothorax or significant pleural effusion is noted. Mild bibasilar subsegmental atelectasis is noted. Severe degenerative changes seen involving the right glenohumeral joint. IMPRESSION: Aortic atherosclerosis.  Mild bibasilar subsegmental atelectasis. Electronically Signed   By: Lupita Raider, M.D.   On: 05/04/2017 13:32   Ct Head Wo Contrast  Result Date: 04/18/2017 CLINICAL DATA:  Per note in pt chart "Pt arrived via ems from [facility] - they called stating that pt "was not acting normal" yet they were unable to explain what her baseline was. EXAM: CT HEAD WITHOUT CONTRAST TECHNIQUE: Contiguous axial images were obtained from the base of the skull through the vertex without intravenous contrast. COMPARISON:  02/09/2017 FINDINGS: Brain: No evidence of acute infarction, hemorrhage, extra-axial collection, ventriculomegaly, or mass effect. Generalized cerebral atrophy. Periventricular white matter low attenuation likely secondary to microangiopathy. Vascular: Cerebrovascular atherosclerotic calcifications are noted. Skull: Negative for fracture or focal lesion. Sinuses/Orbits: Visualized portions of the orbits are unremarkable. Visualized portions of  the paranasal sinuses and mastoid air cells are unremarkable. Other: None. IMPRESSION: 1. No acute intracranial pathology. 2. Chronic microvascular disease and cerebral atrophy. Electronically Signed  By: Elige Ko   On: 04/18/2017 16:21   US Renal  Result Date: 05/16/2017 CLINICAL DATA:  Initial evaluation for acute renal injury. EXAM: RENAL / URINARY TRACT ULTRASOUND COMPLETE COMPARISON:  Prior ultrasound from 11/10/2014. FINDINGS: Right Kidney: Length: 9.3 cm. Mildly increased parenchymal echogenicity with cortical thinning. No hydronephrosis. Multiple echogenic shadowing densities noted within the kidney, likely nonobstructive calculi, largest of which measures 9 mm. No mass lesion. Left Kidney: Length: 12.7 cm. Mildly increased echogenicity with cortical thinning. No hydronephrosis. No focal mass lesion. Bladder: Appears normal for degree of bladder distention. IMPRESSION: 1. Mildly increased parenchymal echogenicity with cortical thinning, suggesting medical renal disease. No hydronephrosis. 2. Nonobstructive right renal nephrolithiasis. Electronically Signed   By: Rise Mu M.D.   On: 05/16/2017 17:20   Dg Chest Port 1 View  Result Date: 05/16/2017 CLINICAL DATA:  83 year old female with a history of fever EXAM: PORTABLE CHEST 1 VIEW COMPARISON:  05/07/2017 FINDINGS: Cardiomediastinal silhouette unchanged in size and contour. Calcifications of the aorta. No evidence of central vascular congestion. Coarsened interstitial markings similar prior. No confluent airspace disease, pneumothorax, or pleural effusion. Changes of vertebroplasty. No displaced fracture IMPRESSION: Chronic lung changes without evidence of superimposed acute cardiopulmonary disease Electronically Signed   By: Gilmer Mor D.O.   On: 05/16/2017 15:14   Dg Chest Port 1 View  Result Date: 05/07/2017 CLINICAL DATA:  Dyspnea EXAM: PORTABLE CHEST 1 VIEW COMPARISON:  05/04/2017 chest radiograph. FINDINGS: Stable  cardiomediastinal silhouette with mild cardiomegaly and aortic atherosclerosis. No pneumothorax. No right pleural effusion. Possible trace left pleural effusion. No overt pulmonary edema. Patchy left lung base opacity, unchanged. Vertebroplasty material overlies the lower thoracic spine. IMPRESSION: 1. Patchy left lung base opacity, unchanged, cannot exclude aspiration or pneumonia . 2. Possible trace left pleural effusion. 3. No overt pulmonary edema. Electronically Signed   By: Delbert Phenix M.D.   On: 05/07/2017 13:08   Dg Chest Portable 1 View  Result Date: 04/18/2017 CLINICAL DATA:  83 year old female with history of weakness and altered mental status. EXAM: PORTABLE CHEST 1 VIEW COMPARISON:  Chest x-ray 07/23/2016. FINDINGS: Lung volumes are low. No definite consolidative airspace disease. No pleural effusions. No evidence of pulmonary edema. Heart size is normal. The patient is rotated to the left on today's exam, resulting in distortion of the mediastinal contours and reduced diagnostic sensitivity and specificity for mediastinal pathology. Atherosclerosis in the thoracic aorta. Post procedural changes of vertebroplasty are noted in the lower thoracic spine. IMPRESSION: 1. Low lung volumes without radiographic evidence of acute cardiopulmonary disease. 2. Aortic atherosclerosis. Electronically Signed   By: Trudie Reed M.D.   On: 04/18/2017 16:08     CBC Recent Labs  Lab 05/16/17 1433 05/17/17 0549  WBC 12.9* 11.3*  HGB 14.8 14.4  HCT 46.8 46.5  PLT 226 146*  MCV 95.2 98.3  MCH 30.1 30.3  MCHC 31.6* 30.8*  RDW 15.1* 16.4*  LYMPHSABS 1.6  --   MONOABS 1.5*  --   EOSABS 0.0  --   BASOSABS 0.0  --     Chemistries  Recent Labs  Lab 05/16/17 1433 05/17/17 0549  NA 163* 163*  K 4.0 3.3*  CL 127* 128*  CO2 26 26  GLUCOSE 108* 122*  BUN 92* 76*  CREATININE 3.23* 2.26*  CALCIUM 10.5* 9.3  AST 28 29  ALT 17 14  ALKPHOS 75 74  BILITOT 1.4* 1.4*    ------------------------------------------------------------------------------------------------------------------ estimated creatinine clearance is 15.5 mL/min (A) (by C-G formula based  on SCr of 2.26 mg/dL (H)). ------------------------------------------------------------------------------------------------------------------ No results for input(s): HGBA1C in the last 72 hours. ------------------------------------------------------------------------------------------------------------------ No results for input(s): CHOL, HDL, LDLCALC, TRIG, CHOLHDL, LDLDIRECT in the last 72 hours. ------------------------------------------------------------------------------------------------------------------ No results for input(s): TSH, T4TOTAL, T3FREE, THYROIDAB in the last 72 hours.  Invalid input(s): FREET3 ------------------------------------------------------------------------------------------------------------------ No results for input(s): VITAMINB12, FOLATE, FERRITIN, TIBC, IRON, RETICCTPCT in the last 72 hours.  Coagulation profile No results for input(s): INR, PROTIME in the last 168 hours.  No results for input(s): DDIMER in the last 72 hours.  Cardiac Enzymes Recent Labs  Lab 05/16/17 1433  TROPONINI 0.57*   ------------------------------------------------------------------------------------------------------------------ Invalid input(s): POCBNP    Assessment & Plan  Patient is a 83 year old admitted with hyponatremia and a urinary tract infection  1.  Hyponatremia suspect due to chronic poor p.o. Intake Continue D5W Follow sodium level Suspect she will not be able to keep herself hydrated in the future  2.  Acute kidney injury again due to dehydration give IV fluids monitor renal function  3.  Urinary tract infection Discontinue vancomycin and Zosyn I will start patient on ceftriaxone  4.  Advanced dementia prognosis poor palliative care input needed  5.   Miscellaneous heparin for DVT prophylaxis     Code Status Orders  (From admission, onward)        Start     Ordered   05/16/17 1900  Do not attempt resuscitation (DNR)  Continuous    Question Answer Comment  In the event of cardiac or respiratory ARREST Do not call a "code blue"   In the event of cardiac or respiratory ARREST Do not perform Intubation, CPR, defibrillation or ACLS   In the event of cardiac or respiratory ARREST Use medication by any route, position, wound care, and other measures to relive pain and suffering. May use oxygen, suction and manual treatment of airway obstruction as needed for comfort.      05/16/17 1859    Code Status History    Date Active Date Inactive Code Status Order ID Comments User Context   11/08/2014 14:53 11/11/2014 21:44 DNR [id number]  Hyacinth Meeker, MD Inpatient   11/06/2014 13:01 11/08/2014 14:53 Full Code [id number]  Otis Brace, MD ED    Advance Directive Documentation     Most Recent Value  Type of Advance Directive  Out of facility DNR (pink MOST or yellow form)  Pre-existing out of facility DNR order (yellow form or pink MOST form)  Yellow form placed in chart (order not valid for inpatient use)  "MOST" Form in Place?  No data           Consults   DVT Prophylaxis heparin Lab Results  Component Value Date   PLT 146 (L) 05/17/2017     Time Spent in minutes   35 minutes greater than 50% of time spent in care coordination and counseling patient regarding the condition and plan of care.   Auburn Bilberry M.D on 05/17/2017 at 2:08 PM  Between 7am to 6pm - Pager - [phone]  After 6pm go to [email or web address] - password [email or web address] [facility]  [facility]   Office  [phone]

## 2017-05-17 NOTE — Progress Notes (Signed)
Patient would not wear treatment and would not let RT hold for her, grabbed neb and would not let go, spilling remaining contents of medication.

## 2017-05-17 NOTE — Progress Notes (Signed)
Sodium reported at 163. MD Sheryle Hailiamond made aware. D5 @ 110/h ordered. Will continue to monitor.

## 2017-05-17 NOTE — Progress Notes (Signed)
PT Cancellation Note  Patient Details Name: Molly Jimenez MRN: [id number] DOB: Feb 14, 1926   Cancelled Treatment:    Reason Eval/Treat Not Completed: Other (comment). Consult received and chart reviewed. Pt at baseline is non-verbal and bedbound. From [facility] facility and will likely return. No PT active needs at this time. Discussed with RN. Will sign off. Please re-order if needs arise.   Bryse Blanchette 05/17/2017, 12:41 PM  Elizabeth PalauStephanie Clema Skousen, PT, DPT [phone]

## 2017-05-17 NOTE — Progress Notes (Signed)
Nonverbal. Turn q2h. Takes meds whole in apple sauce. NSR. Room air. IV abx given. Pt has no further concerns at this time.

## 2017-05-17 NOTE — NC FL2 (Signed)
[place] MEDICAID FL2 LEVEL OF CARE SCREENING TOOL     IDENTIFICATION  Patient Name: Molly Jimenez Birthdate: May 19, 1926 Sex: female Admission Date (Current Location): 05/16/2017  [facility] and [place] Number:  Chiropodist and Address:  [facility], [address], [place], [place] [address]      Provider Number: [id number]  Attending Physician Name and Address:  Auburn Bilberry, MD  Relative Name and Phone Number:       Current Level of Care: Hospital Recommended Level of Care: Skilled Nursing Facility Prior Approval Number:    Date Approved/Denied:   PASRR Number:    Discharge Plan: SNF    Current Diagnoses: Patient Active Problem List   Diagnosis Date Noted  . AKI (acute kidney injury) (HCC) 05/16/2017  . Secondary hyperparathyroidism of renal origin (HCC) 11/10/2014  . Bradycardia   . Diastolic dysfunction 11/08/2014  . Subclinical hyperthyroidism 11/08/2014  . Polyclonal gammopathy determined by serum protein electrophoresis 11/08/2014  . Metabolic encephalopathy   . Hypernatremia 11/06/2014  . Acute renal failure superimposed on stage 3 chronic kidney disease (HCC) 11/06/2014  . UTI (urinary tract infection) 11/06/2014  . Atrial fibrillation with rapid ventricular response (HCC) 11/06/2014  . Hypercalcemia 11/06/2014  . Osteopenia 11/06/2014  . Prediabetes 11/06/2014  . GERD (gastroesophageal reflux disease) 11/06/2014  . Allergic rhinitis 11/06/2014    Orientation RESPIRATION BLADDER Height & Weight     Self(Non verbal- unable to determine orientation)  Normal Incontinent Weight: 128 lb 6.4 oz (58.2 kg) Height:  5\' 6"  (167.6 cm)  BEHAVIORAL SYMPTOMS/MOOD NEUROLOGICAL BOWEL NUTRITION STATUS      Incontinent (Normal)  AMBULATORY STATUS COMMUNICATION OF NEEDS Skin   Independent Does not communicate Normal                       Personal Care Assistance Level of Assistance  Bathing, Feeding, Dressing, Total  care Bathing Assistance: Limited assistance Feeding assistance: Limited assistance Dressing Assistance: Limited assistance Total Care Assistance: Limited assistance   Functional Limitations Info  Sight, Hearing, Speech Sight Info: Adequate Hearing Info: Adequate Speech Info: Impaired    SPECIAL CARE FACTORS FREQUENCY                       Contractures Contractures Info: Not present    Additional Factors Info  Code Status Code Status Info: DNR             Current Medications (05/17/2017):  This is the current hospital active medication list Current Facility-Administered Medications  Medication Dose Route Frequency Provider Last Rate Last Dose  . acetaminophen (TYLENOL) tablet 650 mg  650 mg Oral Q6H PRN Marguarite Arbour, MD       Or  . acetaminophen (TYLENOL) suppository 650 mg  650 mg Rectal Q6H PRN Marguarite Arbour, MD      . aspirin chewable tablet 81 mg  81 mg Oral Jacquenette Shone, MD   81 mg at 05/17/17 0901  . bisacodyl (DULCOLAX) suppository 10 mg  10 mg Rectal Daily PRN Marguarite Arbour, MD      . citalopram (CELEXA) tablet 20 mg  20 mg Oral Jacquenette Shone, MD   20 mg at 05/17/17 0901  . dextrose 5 % solution   Intravenous Continuous Arnaldo Natal, MD 110 mL/hr at 05/17/17 [phone]    . docusate sodium (COLACE) capsule 100 mg  100 mg Oral BID Marguarite Arbour, MD  100 mg at 05/17/17 0901  . feeding supplement (BOOST HIGH PROTEIN) liquid 237 mL  1 Container Oral Jacquenette ShoneBH-q7a Sparks, Jeffrey D, MD   237 mL at 05/17/17 0902  . heparin injection 5,000 Units  5,000 Units Subcutaneous Q8H Marguarite ArbourSparks, Jeffrey D, MD      . hydrALAZINE (APRESOLINE) injection 10 mg  10 mg Intravenous Q4H PRN Marguarite ArbourSparks, Jeffrey D, MD      . ipratropium-albuterol (DUONEB) 0.5-2.5 (3) MG/3ML nebulizer solution 3 mL  3 mL Nebulization Q4H PRN Auburn BilberryPatel, Shreyang, MD      . loratadine (CLARITIN) tablet 10 mg  10 mg Oral Daily PRN Marguarite ArbourSparks, Jeffrey D, MD      . LORazepam (ATIVAN)  injection 0.5 mg  0.5 mg Intravenous Q4H PRN Marguarite ArbourSparks, Jeffrey D, MD      . memantine [facility](NAMENDA) tablet 10 mg  10 mg Oral BID Marguarite ArbourSparks, Jeffrey D, MD   10 mg at 05/17/17 0901  . morphine 2 MG/ML injection 2 mg  2 mg Intravenous Q2H PRN Marguarite ArbourSparks, Jeffrey D, MD      . ondansetron [facility](ZOFRAN) tablet 4 mg  4 mg Oral Q6H PRN Marguarite ArbourSparks, Jeffrey D, MD       Or  . ondansetron (ZOFRAN) injection 4 mg  4 mg Intravenous Q6H PRN Marguarite ArbourSparks, Jeffrey D, MD      . pantoprazole (PROTONIX) injection 40 mg  40 mg Intravenous Q12H Marguarite ArbourSparks, Jeffrey D, MD   40 mg at 05/17/17 0901  . piperacillin-tazobactam (ZOSYN) IVPB 3.375 g  3.375 g Intravenous Q12H Coffee, Gerre PebblesGarrett, [facility] CenterRPH   Stopped at 05/17/17 0446  . vancomycin (VANCOCIN) IVPB 1000 mg/200 mL premix  1,000 mg Intravenous Q48H Coffee, Gerre PebblesGarrett, [facility] LPRPH   Stopped at 05/17/17 [id number]     Discharge Medications: Please see discharge summary for a list of discharge medications.  Relevant Imaging Results:  Relevant Lab Results:   Additional Information SSN [id number]  Cheron SchaumannBandi, Shavonta Gossen M, [place]

## 2017-05-18 LAB — CBC
HEMATOCRIT: 37.1 % (ref 35.0–47.0)
HEMOGLOBIN: 11.9 g/dL — AB (ref 12.0–16.0)
MCH: 30.8 pg (ref 26.0–34.0)
MCHC: 32.2 g/dL (ref 32.0–36.0)
MCV: 95.7 fL (ref 80.0–100.0)
PLATELETS: 130 10*3/uL — AB (ref 150–440)
RBC: 3.87 MIL/uL (ref 3.80–5.20)
RDW: 14.7 % — ABNORMAL HIGH (ref 11.5–14.5)
WBC: 10.1 10*3/uL (ref 3.6–11.0)

## 2017-05-18 LAB — BASIC METABOLIC PANEL
ANION GAP: 5 (ref 5–15)
BUN: 52 mg/dL — ABNORMAL HIGH (ref 6–20)
CHLORIDE: 122 mmol/L — AB (ref 101–111)
CO2: 25 mmol/L (ref 22–32)
CREATININE: 1.53 mg/dL — AB (ref 0.44–1.00)
Calcium: 8.4 mg/dL — ABNORMAL LOW (ref 8.9–10.3)
GFR calc non Af Amer: 29 mL/min — ABNORMAL LOW (ref >60)
GFR, EST AFRICAN AMERICAN: 34 mL/min — AB (ref >60)
Glucose, Bld: 128 mg/dL — ABNORMAL HIGH (ref 65–99)
Potassium: 2.6 mmol/L — CL (ref 3.5–5.1)
Sodium: 152 mmol/L — ABNORMAL HIGH (ref 135–145)

## 2017-05-18 LAB — URINE CULTURE: Culture: NO GROWTH

## 2017-05-18 LAB — MAGNESIUM: MAGNESIUM: 2.1 mg/dL (ref 1.7–2.4)

## 2017-05-18 LAB — POTASSIUM: Potassium: 4.6 mmol/L (ref 3.5–5.1)

## 2017-05-18 MED ORDER — POTASSIUM CHLORIDE 10 MEQ/100ML IV SOLN
10.0000 meq | INTRAVENOUS | Status: AC
  Administered 2017-05-18 (×4): 10 meq via INTRAVENOUS
  Filled 2017-05-18 (×4): qty 100

## 2017-05-18 MED ORDER — DEXTROSE 5 % IV SOLN
INTRAVENOUS | Status: DC
  Administered 2017-05-18 (×2): via INTRAVENOUS

## 2017-05-18 MED ORDER — POTASSIUM CHLORIDE 20 MEQ PO PACK
40.0000 meq | PACK | Freq: Once | ORAL | Status: AC
  Administered 2017-05-18: 40 meq via ORAL
  Filled 2017-05-18: qty 2

## 2017-05-18 MED ORDER — PANTOPRAZOLE SODIUM 40 MG PO TBEC
40.0000 mg | DELAYED_RELEASE_TABLET | Freq: Every day | ORAL | Status: DC
  Administered 2017-05-18 – 2017-05-19 (×2): 40 mg via ORAL
  Filled 2017-05-18 (×2): qty 1

## 2017-05-18 MED ORDER — ENSURE ENLIVE PO LIQD
237.0000 mL | Freq: Two times a day (BID) | ORAL | Status: DC
  Administered 2017-05-18: 237 mL via ORAL

## 2017-05-18 NOTE — Progress Notes (Signed)
Pharmacy Electrolyte Monitoring  83 y/o F with a h/o CKD admitted with AKI and hypernatremia. Pharmacy consulted to monitor and replace electrolytes.   Sodium (mmol/L)  Date Value  05/18/2017 152 (H)   Potassium (mmol/L)  Date Value  05/18/2017 2.6 (LL)   Magnesium (mg/dL)  Date Value  16/10/960404/26/2016 2.5   Phosphorus (mg/dL)  Date Value  54/09/811904/25/2016 2.8   Calcium (mg/dL)  Date Value  14/78/295611/11/2016 8.4 (L)   Albumin (g/dL)  Date Value  21/30/865711/10/2016 3.6   Sodium is decreasing quickly and D5 stopped. Patient is receiving 4 K runs. Will give KCl 40 meq po once and add recheck K at 1200. Mg is WNL.  Will f/u plans for monitoring sodium.   Luisa HartScott Tonianne Fine, PharmD Clinical Pharmacist

## 2017-05-18 NOTE — Progress Notes (Signed)
Notified Dr.Diamond of potassium level of 2.6, new orders given.

## 2017-05-18 NOTE — Progress Notes (Signed)
PHARMACIST - PHYSICIAN COMMUNICATION  DR:   Allena KatzPatel  CONCERNING: IV to Oral Route Change Policy  RECOMMENDATION: This patient is receiving pantoprazole by the intravenous route.  Based on criteria approved by the Pharmacy and Therapeutics Committee, the intravenous medication(s) is/are being converted to the equivalent oral dose form(s).   DESCRIPTION: These criteria include:  The patient is eating (either orally or via tube) and/or has been taking other orally administered medications for a least 24 hours  The patient has no evidence of active gastrointestinal bleeding or impaired GI absorption (gastrectomy, short bowel, patient on TNA or NPO).  If you have questions about this conversion, please contact the Pharmacy Department  []   [phone]( [phone] )  [facility] [x]   [phone] )  [facility] []   [phone]( [phone] )  [facility] []   [phone]( [phone] )  [facility] []   [phone]( [phone] )  [facility]   Valentina GuChristy, Woods Gangemi D, [facility] HospitalRPH 05/18/2017 9:01 AM

## 2017-05-18 NOTE — Plan of Care (Signed)
No changes noted at this time , will continue to monitor

## 2017-05-18 NOTE — Plan of Care (Signed)
Patient sodium level still high ,MD  aware on iv fluid dextrose in water , care ongoing .

## 2017-05-18 NOTE — Progress Notes (Signed)
[facility] [place] at [facility]   PATIENT NAME: Molly Jimenez    MR#:  [id number]  DATE OF BIRTH:  05/06/1926  SUBJECTIVE:   Patient being fed by CNA Nonverbal.  Has severe dementia at baseline REVIEW OF SYSTEMS:   Review of Systems  Unable to perform ROS: Dementia   Tolerating Diet: yes Tolerating PT: no  DRUG ALLERGIES:  No Known Allergies  VITALS:  Blood pressure 124/62, pulse 80, temperature 97.8 F (36.6 C), temperature source Oral, resp. rate 20, height 5\' 6"  (1.676 m), weight 59.4 kg (130 lb 14.4 oz), SpO2 96 %.  PHYSICAL EXAMINATION:   Physical Exam limited due to severe dementia  GENERAL:  83 y.o.-year-old patient lying in the bed with no acute distress.  EYES: Pupils equal, round, reactive to light and accommodation. No scleral icterus. Extraocular muscles intact.  HEENT: Head atraumatic, normocephalic. Oropharynx and nasopharynx clear.  NECK:  Supple, no jugular venous distention. No thyroid enlargement, no tenderness.  LUNGS: Normal breath sounds bilaterally, no wheezing, rales, rhonchi. No use of accessory muscles of respiration.  CARDIOVASCULAR: S1, S2 normal. No murmurs, rubs, or gallops.  ABDOMEN: Soft, nontender, nondistended. Bowel sounds present. No organomegaly or mass.  EXTREMITIES: No cyanosis, clubbing or edema b/l.    NEUROLOGIC:unable to assess PSYCHIATRIC:  patient is alert but nonverbal  SKIN:chronic skin decubitus present on admission  LABORATORY PANEL:  CBC Recent Labs  Lab 05/18/17 0428  WBC 10.1  HGB 11.9*  HCT 37.1  PLT 130*    Chemistries  Recent Labs  Lab 05/17/17 0549 05/18/17 0428 05/18/17 1210  NA 163* 152*  --   K 3.3* 2.6* 4.6  CL 128* 122*  --   CO2 26 25  --   GLUCOSE 122* 128*  --   BUN 76* 52*  --   CREATININE 2.26* 1.53*  --   CALCIUM 9.3 8.4*  --   MG  --  2.1  --   AST 29  --   --   ALT 14  --   --   ALKPHOS 74  --   --   BILITOT 1.4*  --   --    Cardiac Enzymes Recent  Labs  Lab 05/16/17 1433  TROPONINI 0.57*   RADIOLOGY:  [place] Renal  Result Date: 05/16/2017 CLINICAL DATA:  Initial evaluation for acute renal injury. EXAM: RENAL / URINARY TRACT ULTRASOUND COMPLETE COMPARISON:  Prior ultrasound from 11/10/2014. FINDINGS: Right Kidney: Length: 9.3 cm. Mildly increased parenchymal echogenicity with cortical thinning. No hydronephrosis. Multiple echogenic shadowing densities noted within the kidney, likely nonobstructive calculi, largest of which measures 9 mm. No mass lesion. Left Kidney: Length: 12.7 cm. Mildly increased echogenicity with cortical thinning. No hydronephrosis. No focal mass lesion. Bladder: Appears normal for degree of bladder distention. IMPRESSION: 1. Mildly increased parenchymal echogenicity with cortical thinning, suggesting medical renal disease. No hydronephrosis. 2. Nonobstructive right renal nephrolithiasis. Electronically Signed   By: Rise MuBenjamin  McClintock M.D.   On: 05/16/2017 17:20   Dg Chest Port 1 View  Result Date: 05/16/2017 CLINICAL DATA:  83 year old female with a history of fever EXAM: PORTABLE CHEST 1 VIEW COMPARISON:  05/07/2017 FINDINGS: Cardiomediastinal silhouette unchanged in size and contour. Calcifications of the aorta. No evidence of central vascular congestion. Coarsened interstitial markings similar prior. No confluent airspace disease, pneumothorax, or pleural effusion. Changes of vertebroplasty. No displaced fracture IMPRESSION: Chronic lung changes without evidence of superimposed acute cardiopulmonary disease Electronically Signed   By: Gilmer MorJaime  Wagner D.O.  On: 05/16/2017 15:14   ASSESSMENT AND PLAN:  83 year old admitted with hyponatremia and a urinary tract infection  1.Severe Hyponatremia suspect due to chronic poor p.o. Intake Continue D5W -Came in with sodium of 163--- IV fluid--- 152 -Continue oral and IV hydration  2.  Acute kidney injury again due to dehydration give IV fluids monitor renal  function -Monitor I's and O's  3.  Urinary tract infection -Blood culture urine culture negative.  DC antibiotic.  Patient is hemodynamically able.  No fever   4.  Advanced dementia prognosis poor  5.  Miscellaneous heparin for DVT prophylaxis  Patient is a no code DNR   Case discussed with Care Management/Social Worker. Management plans discussed with the patient, family and they are in agreement.  CODE STATUS: DNR  DVT Prophylaxis: Heparin  TOTAL TIME TAKING CARE OF THIS PATIENT: 30 minutes.  >50% time spent on counselling and coordination of care  POSSIBLE D/C IN 1-2 DAYS, DEPENDING ON CLINICAL CONDITION.  Note: This dictation was prepared with Dragon dictation along with smaller phrase technology. Any transcriptional errors that result from this process are unintentional.  Luretha Eberly M.D on 05/18/2017 at 2:38 PM  Between 7am to 6pm - Pager - [phone]  After 6pm go to [email or web address] - password [email or web address]  [facility]  Office  [phone]  CC: Primary care physician; System, Pcp Not In

## 2017-05-18 NOTE — Plan of Care (Signed)
Provide assisted small frequent meals. Patient will eat vanilla ice cream. Patient must be fully aroused. Attempt to feed 100% of applesauce with medication administration.

## 2017-05-18 NOTE — Progress Notes (Signed)
No charge note:   Palliative consult received.   Called and left message for patient's guardian requesting return call to discuss GOC.  Ocie BobKasie Fina Heizer, AGNP-C Palliative Medicine  Please call Palliative Medicine team phone with any questions [phone]. For individual providers please see AMION.

## 2017-05-18 NOTE — Progress Notes (Addendum)
Initial Nutrition Assessment  DOCUMENTATION CODES:   Not applicable  INTERVENTION:  1. Ensure Enlive po BID, each supplement provides 350 kcal and 20 grams of protein 2. Recommend Speech Evaluation to determine appropriate consistency for diet and thickened liquids  NUTRITION DIAGNOSIS:   Inadequate oral intake related to chronic illness as evidenced by per patient/family report.  GOAL:   Patient will meet greater than or equal to 90% of their needs  MONITOR:   PO intake, I & O's, Supplement acceptance, Weight trends  REASON FOR ASSESSMENT:   Low Braden    ASSESSMENT:   [name] has a PMH of CKD, HLD, GERD, Dementia, Chronically bedbound non-verbal, presents from ALF to ED for hypernatremia, AKI, UTI, Metabolic Encephelopathy  [facility]. Non-Verbal, unable to provide history. Ate about 15% of eggs this morning and all of her apple sauce per nurse aide who fed her.  [facility]. She was recently placed on NDD1- diet and thickened liquids PTA due to aspiration.States she was not consuming much more than 20% at each meal. Despite this, NFPE still appears WNL for chronically bed bound patient. Was unsure of her weight, states "we haven't been able to weigh her for a long time." Per chart, she is around 130 pounds now.   Labs reviewed:  Na 152, K 2.6, BUN/Creatinine 52/1.53, TBili 1.4  Medications reviewed and include:  Colace   NUTRITION - FOCUSED PHYSICAL EXAM:    Most Recent Value  Orbital Region  No depletion  Upper Arm Region  No depletion  Thoracic and Lumbar Region  No depletion  Buccal Region  No depletion  Temple Region  No depletion  Clavicle Bone Region  No depletion  Clavicle and Acromion Bone Region  No depletion  Scapular Bone Region  No depletion  Dorsal Hand  No depletion  Patellar Region  No depletion  Anterior Thigh Region  No depletion  Posterior Calf Region  No depletion  Edema (RD Assessment)  None   Hair  Reviewed  Eyes  Reviewed  Mouth  Reviewed  Skin  Reviewed  Nails  Reviewed       Diet Order:  DIET SOFT Room service appropriate? Yes; Fluid consistency: Thin  EDUCATION NEEDS:   Not appropriate for education at this time  Skin:  Skin Assessment: Reviewed RN Assessment  Last BM:  PTA  Height:   Ht Readings from Last 1 Encounters:  05/16/17 5\' 6"  (1.676 m)    Weight:   Wt Readings from Last 1 Encounters:  05/18/17 130 lb 14.4 oz (59.4 kg)    Ideal Body Weight:  59.09 kg  BMI:  Body mass index is 21.13 kg/m.  Estimated Nutritional Needs:   Kcal:  1350-1550 calories (MSJ x1.3-1.5)  Protein:  77-89 grams (1.3-1.5g/kg)  Fluid:  >1.5L   Molly AnoWilliam M. Yeiren Whitecotton, MS, RD LDN Inpatient Clinical Dietitian Pager [phone]

## 2017-05-19 DIAGNOSIS — Z515 Encounter for palliative care: Secondary | ICD-10-CM

## 2017-05-19 DIAGNOSIS — A419 Sepsis, unspecified organism: Secondary | ICD-10-CM

## 2017-05-19 DIAGNOSIS — Z7189 Other specified counseling: Secondary | ICD-10-CM

## 2017-05-19 DIAGNOSIS — N179 Acute kidney failure, unspecified: Principal | ICD-10-CM

## 2017-05-19 LAB — BASIC METABOLIC PANEL
Anion gap: 5 (ref 5–15)
BUN: 40 mg/dL — ABNORMAL HIGH (ref 6–20)
CHLORIDE: 117 mmol/L — AB (ref 101–111)
CO2: 23 mmol/L (ref 22–32)
CREATININE: 1.25 mg/dL — AB (ref 0.44–1.00)
Calcium: 8.4 mg/dL — ABNORMAL LOW (ref 8.9–10.3)
GFR calc Af Amer: 43 mL/min — ABNORMAL LOW (ref >60)
GFR calc non Af Amer: 37 mL/min — ABNORMAL LOW (ref >60)
GLUCOSE: 108 mg/dL — AB (ref 65–99)
POTASSIUM: 3.6 mmol/L (ref 3.5–5.1)
SODIUM: 145 mmol/L (ref 135–145)

## 2017-05-19 NOTE — Progress Notes (Signed)
No charge note:   Meeting with Donney Rankinsheesha Carr, pt guardian at 11am today [facility].  Ocie BobKasie Mahan, AGNP-C Palliative Medicine  Please call Palliative Medicine team phone with any questions [phone]. For individual providers please see AMION.

## 2017-05-19 NOTE — Progress Notes (Signed)
Notifed Dr.Diamond of EKG changes, no new orders given.

## 2017-05-19 NOTE — Care Management (Signed)
Spoke with Kendal [facility]Bonnie at [facility] and patient is currently receiving home health through [facility]. Notified agency.  Kendal HymenBonnie asked about hospice.  Palliative is going to speak with patient's guardian at 7211 today. Offered outpatient palliative.  Kendal HymenBonnie says that "just palliative is not helpful." Notified [facility] of admission and discharging today.

## 2017-05-19 NOTE — Progress Notes (Signed)
Patient converted to sinus arrhythmia. 12 lead EKG completed to confirm CCMT reports. Patient appears to be asymptomatic. Will continue to monitor and assess.

## 2017-05-19 NOTE — Care Management Note (Addendum)
Case Management Note  Patient Details  Name: EVELINA LORE MRN: [id number] Date of Birth: [birth year]  Subjective/Objective:                 Palliative met with guardian and decision has been made to discharge with hospice services.  Agency preference is [facility].  Per Horris Latino- patient has a hospital bed.  Patient says her program director will be signing the IM and faxing it back  Action/Plan:  Notified [facility] of referral and of discharge today  Expected Discharge Date:  05/19/17               Expected Discharge Plan:     In-House Referral:     Discharge planning Services     Post Acute Care Choice:    Choice offered to:     DME Arranged:    DME Agency:     HH Arranged:  RN [place] Agency:  [facility]  Status of Service:  Completed, signed off  If discussed at [place] of Stay Meetings, dates discussed:    Additional Comments:  Katrina Stack, RN 05/19/2017, 11:48 AM

## 2017-05-19 NOTE — Progress Notes (Signed)
Patient picked up by EMS. VSS. Patient resting quietly. Belongings and packet sent with EMS.

## 2017-05-19 NOTE — NC FL2 (Signed)
MEDICAID FL2 LEVEL OF CARE SCREENING TOOL     IDENTIFICATION  Patient Name: Molly Jimenez Birthdate: 1925-07-21 Sex: female Admission Date (Current Location): 05/16/2017  [facility] [place] [place]   Facility and Address:  [facility], [address], [place], [place] [address]      Provider Number: [id number]  Attending Physician Name and Address:  Enedina FinnerPatel, Sona, MD  Relative Name and Phone Number:       Current Level of Care: Hospital Recommended Level of Care: Assisted Living Facility Prior Approval Number:    Date Approved/Denied:   PASRR Number:    Discharge Plan: Domiciliary ([facility])    Current Diagnoses: Patient Active Problem List   Diagnosis Date Noted  . AKI (acute kidney injury) (HCC) 05/16/2017  . Secondary hyperparathyroidism of renal origin (HCC) 11/10/2014  . Bradycardia   . Diastolic dysfunction 11/08/2014  . Subclinical hyperthyroidism 11/08/2014  . Polyclonal gammopathy determined by serum protein electrophoresis 11/08/2014  . Metabolic encephalopathy   . Hypernatremia 11/06/2014  . Acute renal failure superimposed on stage 3 chronic kidney disease (HCC) 11/06/2014  . UTI (urinary tract infection) 11/06/2014  . Atrial fibrillation with rapid ventricular response (HCC) 11/06/2014  . Hypercalcemia 11/06/2014  . Osteopenia 11/06/2014  . Prediabetes 11/06/2014  . GERD (gastroesophageal reflux disease) 11/06/2014  . Allergic rhinitis 11/06/2014    Orientation RESPIRATION BLADDER Height & Weight     Self  Normal Incontinent Weight: 135 lb 1.6 oz (61.3 kg) Height:  5\' 6"  (167.6 cm)  BEHAVIORAL SYMPTOMS/MOOD NEUROLOGICAL BOWEL NUTRITION STATUS      Continent Diet(Regular)  AMBULATORY STATUS COMMUNICATION OF NEEDS Skin   Extensive Assist Does not communicate Normal                       Personal Care Assistance Level of Assistance  Bathing, Feeding, Dressing, Total care  Bathing Assistance: Limited assistance Feeding assistance: Maximum assistance Dressing Assistance: Limited assistance Total Care Assistance: Limited assistance   Functional Limitations Info  Sight, Hearing, Speech Sight Info: Adequate Hearing Info: Adequate Speech Info: Impaired    SPECIAL CARE FACTORS FREQUENCY                       Contractures Contractures Info: Not present    Additional Factors Info  Code Status, Psychotropic Code Status Info: DNR   Psychotropic Info: citalopram (CELEXA) tablet 20 mg          Current Medications (05/19/2017):  This is the current hospital active medication list Current Facility-Administered Medications  Medication Dose Route Frequency Provider Last Rate Last Dose  . acetaminophen (TYLENOL) tablet 650 mg  650 mg Oral Q6H PRN Marguarite ArbourSparks, Jeffrey D, MD       Or  . acetaminophen (TYLENOL) suppository 650 mg  650 mg Rectal Q6H PRN Marguarite ArbourSparks, Jeffrey D, MD      . aspirin chewable tablet 81 mg  81 mg Oral Jacquenette ShoneBH-q7a Sparks, Jeffrey D, MD   81 mg at 05/19/17 1052  . bisacodyl (DULCOLAX) suppository 10 mg  10 mg Rectal Daily PRN Marguarite ArbourSparks, Jeffrey D, MD      . citalopram (CELEXA) tablet 20 mg  20 mg Oral Jacquenette ShoneBH-q7a Sparks, Jeffrey D, MD   20 mg at 05/19/17 1052  . docusate sodium (COLACE) capsule 100 mg  100 mg Oral BID Marguarite ArbourSparks, Jeffrey D, MD   100 mg at 05/18/17 2229  . feeding supplement (BOOST HIGH PROTEIN) liquid 237  mL  1 Container Oral Jacquenette Shone, MD   237 mL at 05/18/17 6045  . feeding supplement (ENSURE ENLIVE) (ENSURE ENLIVE) liquid 237 mL  237 mL Oral BID BM Auburn Bilberry, MD   237 mL at 05/18/17 1403  . heparin injection 5,000 Units  5,000 Units Subcutaneous Q8H Marguarite Arbour, MD   5,000 Units at 05/19/17 0538  . hydrALAZINE (APRESOLINE) injection 10 mg  10 mg Intravenous Q4H PRN Marguarite Arbour, MD      . ipratropium-albuterol (DUONEB) 0.5-2.5 (3) MG/3ML nebulizer solution 3 mL  3 mL Nebulization Q4H PRN Auburn Bilberry, MD       . loratadine (CLARITIN) tablet 10 mg  10 mg Oral Daily PRN Marguarite Arbour, MD      . LORazepam (ATIVAN) injection 0.5 mg  0.5 mg Intravenous Q4H PRN Marguarite Arbour, MD      . memantine [facility]) tablet 10 mg  10 mg Oral BID Marguarite Arbour, MD   10 mg at 05/19/17 1052  . ondansetron (ZOFRAN) tablet 4 mg  4 mg Oral Q6H PRN Marguarite Arbour, MD       Or  . ondansetron (ZOFRAN) injection 4 mg  4 mg Intravenous Q6H PRN Marguarite Arbour, MD      . pantoprazole (PROTONIX) EC tablet 40 mg  40 mg Oral Daily Enedina Finner, MD   40 mg at 05/19/17 1052     Discharge Medications: Please see discharge summary for a list of discharge medications.  DISCHARGE MEDICATIONS:       Current Discharge Medication List        CONTINUE these medications which have NOT CHANGED   Details  aspirin 81 MG chewable tablet Chew 81 mg by mouth every morning.    atorvastatin (LIPITOR) 10 MG tablet Take 10 mg by mouth every morning.    Calcium Carbonate-Vitamin D 600-400 MG-UNIT per tablet Take 1 tablet by mouth 2 (two) times daily.    chlorhexidine (PERIDEX) 0.12 % solution Use as directed 15 mLs in the mouth or throat at bedtime.    citalopram (CELEXA) 20 MG tablet Take 20 mg by mouth every morning.    memantine (NAMENDA) 10 MG tablet Take 10 mg by mouth 2 (two) times daily.    Multiple Vitamins-Minerals (CENTRUM SILVER PO) Take 1 tablet by mouth every morning.    polyethylene glycol (MIRALAX / GLYCOLAX) packet Take 17 g by mouth daily.    acetaminophen (TYLENOL) 325 MG tablet Take 650 mg by mouth 3 (three) times daily.     feeding supplement (BOOST HIGH PROTEIN) LIQD Take 1 Container by mouth every morning.    guaifenesin (ROBITUSSIN) 100 MG/5ML syrup Take 200 mg by mouth 3 (three) times daily as needed for cough or congestion.    loperamide (IMODIUM A-D) 2 MG tablet Take 2 mg by mouth every 12 (twelve) hours as needed for diarrhea or loose stools.    loratadine (CLARITIN) 10 MG  tablet Take 10 mg by mouth daily as needed for allergies.    omeprazole (PRILOSEC) 20 MG capsule Take 20 mg by mouth daily as needed (heartburn).    pyrithione zinc (HEAD AND SHOULDERS) 1 % shampoo Apply 1 application topically daily as needed for itching.    sodium fluoride (DENTAGEL) 1.1 % GEL dental gel Place 1 application onto teeth 2 (two) times daily.    triamcinolone cream (KENALOG) 0.1 % Apply 1 application topically 2 (two) times daily. Apply to affected areas of rash  at bedtime          STOP taking these medications     levofloxacin (LEVAQUIN) 25 MG/ML solution      amoxicillin-clavulanate (AUGMENTIN) 875-125 MG tablet      doxycycline (VIBRAMYCIN) 100 MG capsule      Relevant Imaging Results:  Relevant Lab Results:   Additional Information SSN [id number]  Darleene Cleavernterhaus, Adilen Pavelko R, [place]

## 2017-05-19 NOTE — Care Management Important Message (Signed)
Important Message  Patient Details  Name: Molly Jimenez MRN: [id number] Date of Birth: 1926/05/23   Medicare Important Message Given:  Yes There is no IM notice in [employer].  Contacted patient's guardian Molly Jimenez [phone] and reviewed notice and faxed it to her for signature.    Eber HongGreene, Graceson Nichelson R, RN 05/19/2017, 10:03 AM

## 2017-05-19 NOTE — Discharge Summary (Addendum)
[facility] [place] at [facility]   PATIENT NAME: Molly Jimenez    MR#:  [id number]  DATE OF BIRTH:  03-Jun-1926  DATE OF ADMISSION:  05/16/2017 ADMITTING PHYSICIAN: Marguarite ArbourJeffrey D Sparks, MD  DATE OF DISCHARGE: 05/19/2017  PRIMARY CARE PHYSICIAN: System, Pcp Not In    ADMISSION DIAGNOSIS:  Hyperkalemia [E87.5] AKI (acute kidney injury) (HCC) [N17.9] Sepsis, due to unspecified organism (HCC) [A41.9]  DISCHARGE DIAGNOSIS:  Severe hyponatremia and acute renal failure secondary to poor p.o. Intake--- improved Severe dementia  SECONDARY DIAGNOSIS:   Past Medical History:  Diagnosis Date  . GERD (gastroesophageal reflux disease)   . Hypercholesteremia   . Renal disorder     HOSPITAL COURSE:  83 year old admitted with hyponatremia and a urinary tract infection  1.SevereHyponatremia suspect due to chronic poor p.o. Intake Continue D5W -Came in with sodium of 163--- IV fluid--- 152--145 -Patient received IV hydration with D5 water  2.Acute kidney injury again due to dehydration give IV fluids  monitor renal function -Monitor I's and O's---she has had good urine output -Patient's renal function is at baseline  3. Abnormal UA -Blood culture urine culture negative.  DCed antibiotic.  Patient is hemodynamically able.  No fever   4.Advanced dementia - prognosis poor -Palliative care has left message for legal guardian to discuss goals of care  5.Miscellaneous heparin for DVT prophylaxis  Patient is a no code DNR Patient appears at baseline.  Discharge to her long-term facility   CONSULTS OBTAINED:  Treatment Team:  Enedina FinnerPatel, Jun Osment, MD  DRUG ALLERGIES:  No Known Allergies  DISCHARGE MEDICATIONS:   Current Discharge Medication List    CONTINUE these medications which have NOT CHANGED   Details  aspirin 81 MG chewable tablet Chew 81 mg by mouth every morning.    atorvastatin (LIPITOR) 10 MG tablet Take 10 mg by mouth every  morning.    Calcium Carbonate-Vitamin D 600-400 MG-UNIT per tablet Take 1 tablet by mouth 2 (two) times daily.    chlorhexidine (PERIDEX) 0.12 % solution Use as directed 15 mLs in the mouth or throat at bedtime.    citalopram (CELEXA) 20 MG tablet Take 20 mg by mouth every morning.    memantine (NAMENDA) 10 MG tablet Take 10 mg by mouth 2 (two) times daily.    Multiple Vitamins-Minerals (CENTRUM SILVER PO) Take 1 tablet by mouth every morning.    polyethylene glycol (MIRALAX / GLYCOLAX) packet Take 17 g by mouth daily.    acetaminophen (TYLENOL) 325 MG tablet Take 650 mg by mouth 3 (three) times daily.     feeding supplement (BOOST HIGH PROTEIN) LIQD Take 1 Container by mouth every morning.    guaifenesin (ROBITUSSIN) 100 MG/5ML syrup Take 200 mg by mouth 3 (three) times daily as needed for cough or congestion.    loperamide (IMODIUM A-D) 2 MG tablet Take 2 mg by mouth every 12 (twelve) hours as needed for diarrhea or loose stools.    loratadine (CLARITIN) 10 MG tablet Take 10 mg by mouth daily as needed for allergies.    omeprazole (PRILOSEC) 20 MG capsule Take 20 mg by mouth daily as needed (heartburn).    pyrithione zinc (HEAD AND SHOULDERS) 1 % shampoo Apply 1 application topically daily as needed for itching.    sodium fluoride (DENTAGEL) 1.1 % GEL dental gel Place 1 application onto teeth 2 (two) times daily.    triamcinolone cream (KENALOG) 0.1 % Apply 1 application topically 2 (two) times daily. Apply to affected areas  of rash at bedtime       STOP taking these medications     levofloxacin (LEVAQUIN) 25 MG/ML solution      amoxicillin-clavulanate (AUGMENTIN) 875-125 MG tablet      doxycycline (VIBRAMYCIN) 100 MG capsule         If you experience worsening of your admission symptoms, develop shortness of breath, life threatening emergency, suicidal or homicidal thoughts you must seek medical attention immediately by calling 911 or calling your MD immediately  if  symptoms less severe.  You Must read complete instructions/literature along with all the possible adverse reactions/side effects for all the Medicines you take and that have been prescribed to you. Take any new Medicines after you have completely understood and accept all the possible adverse reactions/side effects.   Please note  You were cared for by a hospitalist during your hospital stay. If you have any questions about your discharge medications or the care you received while you were in the hospital after you are discharged, you can call the unit and asked to speak with the hospitalist on call if the hospitalist that took care of you is not available. Once you are discharged, your primary care physician will handle any further medical issues. Please note that NO REFILLS for any discharge medications will be authorized once you are discharged, as it is imperative that you return to your primary care physician (or establish a relationship with a primary care physician if you do not have one) for your aftercare needs so that they can reassess your need for medications and monitor your lab values. Today   SUBJECTIVE   Nonverbal No issues per RN  VITAL SIGNS:  Blood pressure 104/81, pulse 80, temperature 98.6 F (37 C), temperature source Oral, resp. rate 16, height 5\' 6"  (1.676 m), weight 61.3 kg (135 lb 1.6 oz), SpO2 98 %.  I/O:    Intake/Output Summary (Last 24 hours) at 05/19/2017 0921 Last data filed at 05/19/2017 0658 Gross per 24 hour  Intake 1216.67 ml  Output 1050 ml  Net 166.67 ml    PHYSICAL EXAMINATION:  Limited secondary to severe dementia  gENERAL:  83 y.o.-year-old patient lying in the bed with no acute distress.    LUNGS: Normal breath sounds bilaterally, no wheezing, rales,rhonchi or crepitation. No use of accessory muscles of respiration.  CARDIOVASCULAR: S1, S2 normal. No murmurs, rubs, or gallops.  ABDOMEN: Soft, non-tender, non-distended. Bowel sounds present.  No organomegaly or mass.  EXTREMITIES: No pedal edema, cyanosis, or clubbing.  NEUROLOGIC: Able to assess  pSYCHIATRIC: Opens eyes to verbal command.  Patient is nonverbal SKIN: No obvious rash, lesion, or ulcer.   DATA REVIEW:   CBC  Recent Labs  Lab 05/18/17 0428  WBC 10.1  HGB 11.9*  HCT 37.1  PLT 130*    Chemistries  Recent Labs  Lab 05/17/17 0549 05/18/17 0428  05/19/17 0517  NA 163* 152*  --  145  K 3.3* 2.6*   < > 3.6  CL 128* 122*  --  117*  CO2 26 25  --  23  GLUCOSE 122* 128*  --  108*  BUN 76* 52*  --  40*  CREATININE 2.26* 1.53*  --  1.25*  CALCIUM 9.3 8.4*  --  8.4*  MG  --  2.1  --   --   AST 29  --   --   --   ALT 14  --   --   --   ALKPHOS 74  --   --   --  BILITOT 1.4*  --   --   --    < > = values in this interval not displayed.    Microbiology Results   Recent Results (from the past 240 hour(s))  Blood Culture (routine x 2)     Status: None (Preliminary result)   Collection Time: 05/16/17  2:33 PM  Result Value Ref Range Status   Specimen Description BLOOD Blood Culture adequate volume  Final   Special Requests   Final    BOTTLES DRAWN AEROBIC AND ANAEROBIC BLOOD LEFT FOREARM   Culture NO GROWTH 3 DAYS  Final   Report Status PENDING  Incomplete  Urine culture     Status: None   Collection Time: 05/16/17  2:33 PM  Result Value Ref Range Status   Specimen Description URINE, CLEAN CATCH  Final   Special Requests NONE  Final   Culture   Final    NO GROWTH Performed at [facility], [address], [place], [place] [address]    Report Status 05/18/2017 FINAL  Final  Blood Culture (routine x 2)     Status: None (Preliminary result)   Collection Time: 05/16/17  2:38 PM  Result Value Ref Range Status   Specimen Description   Final    BLOOD Blood Culture results may not be optimal due to an excessive volume of blood received in culture bottles   Special Requests   Final    BOTTLES DRAWN AEROBIC AND ANAEROBIC BLOOD RIGHT FOREARM    Culture NO GROWTH 3 DAYS  Final   Report Status PENDING  Incomplete  MRSA PCR Screening     Status: None   Collection Time: 05/17/17  7:55 PM  Result Value Ref Range Status   MRSA by PCR NEGATIVE NEGATIVE Final    Comment:        The GeneXpert MRSA Assay (FDA approved for NASAL specimens only), is one component of a comprehensive MRSA colonization surveillance program. It is not intended to diagnose MRSA infection nor to guide or monitor treatment for MRSA infections.     RADIOLOGY:  No results found.   Management plans discussed with the patient, family and they are in agreement.  CODE STATUS:     Code Status Orders  (From admission, onward)        Start     Ordered   05/16/17 1900  Do not attempt resuscitation (DNR)  Continuous    Question Answer Comment  In the event of cardiac or respiratory ARREST Do not call a "code blue"   In the event of cardiac or respiratory ARREST Do not perform Intubation, CPR, defibrillation or ACLS   In the event of cardiac or respiratory ARREST Use medication by any route, position, wound care, and other measures to relive pain and suffering. May use oxygen, suction and manual treatment of airway obstruction as needed for comfort.      05/16/17 1859    Code Status History    Date Active Date Inactive Code Status Order ID Comments User Context   11/08/2014 14:53 11/11/2014 21:44 DNR [id number]  Hyacinth Meeker, MD Inpatient   11/06/2014 13:01 11/08/2014 14:53 Full Code [id number]  Otis Brace, MD ED    Advance Directive Documentation     Most Recent Value  Type of Advance Directive  Out of facility DNR (pink MOST or yellow form)  Pre-existing out of facility DNR order (yellow form or pink MOST form)  Yellow form placed in chart (order not valid for inpatient  use)  "MOST" Form in Place?  No data      TOTAL TIME TAKING CARE OF THIS PATIENT: 40 minutes.    Allanah Mcfarland M.D on 05/19/2017 at 9:21 AM  Between 7am to 6pm - Pager -  [phone] After 6pm go to [email or web address] - password [email or web address]  [facility]  Office  [phone]  CC: Primary care physician; System, Pcp Not In

## 2017-05-19 NOTE — Progress Notes (Signed)
Patient planning to discharge back to [facility].

## 2017-05-19 NOTE — Consult Note (Signed)
Consultation Note Date: 05/19/2017   Patient Name: Molly Jimenez  DOB: 1926/03/03  MRN: [id number]  Age / Sex: 83 y.o., female  PCP: System, Pcp Not In Referring Physician: Fritzi Mandes, MD  Reason for Consultation: Establishing goals of care  HPI/Patient Profile: 83 y.o. female  with past medical history of HOH, CHF, A.fib, CKD, HLD, advanced dementia,  admitted on 05/16/2017 with urosepsis and hypernatremia. During admission she is noted to be nonverbal, with poor po intake. Palliative medicine consulted for [name].   Clinical Assessment and Goals of Care:  I have reviewed medical records including EPIC notes, labs and imaging, assessed the patient and then met at the bedside along with Molly Jimenez- patient's DSS guardian  to discuss diagnosis prognosis, [place], EOL wishes, disposition and options.  I introduced Palliative Medicine as specialized medical care for people living with serious illness. It focuses on providing relief from the symptoms and stress of a serious illness. The goal is to improve quality of life for both the patient and the family.  We discussed a brief life review of the patient. Molly Jimenez has been the patient's guardian for the last six years. Patient has no family or friends, and Molly Jimenez is uncertain of her past history. Patient [facility] at [facility].  As far as functional and nutritional status- patient is bed bound, dependent for all ADL's. She eats when fed, but doesn't request food or drink. She will answer one question "How are you?" with "Fine"  If asked very loudly due to being very [facility], but is otherwise nonverbal. She does not smile or initiate conversation. She does not otherwise interact.     We discussed their current illness and what it means in the larger context of their on-going co-morbidities.  Natural disease trajectory and expectations at EOL were discussed. Molly Jimenez  has a good understanding of the overall trajectory of dementia. Patient has had speech therapy at the facility and it has been noted that patient's ability to swallow has also decreased and patient has been aspirating.   I attempted to elicit values and goals of care important to the patient. Unfortunately, patient's values are unknown as little is known about her. However, Molly Jimenez is certain that she would not want to suffer or continue to return to hospital if continued hospitalizations would not improve her overall quality of life or function.   The difference between aggressive medical intervention and comfort care was considered in light of the patient's goals of care.   Advanced directives, concepts specific to code status, artifical feeding and hydration, and rehospitalization were considered and discussed.  Hospice and Palliative Care services outpatient were explained and offered. Molly Jimenez agrees with medical recommendations of Hospice support at facility at discharge.  Questions and concerns were addressed.  Hard Choices booklet left for review. The family was encouraged to call with questions or concerns.   Primary Decision Maker LEGAL GUARDIAN - Molly Jimenez    SUMMARY OF RECOMMENDATIONS  -Discharge to facility with [facility] is support through EOL -If  UTI or pneumonia occurs guardian requests transition to comfort care  Code Status/Advance Care Planning:  DNR  Additional Recommendations (Limitations, Scope, Preferences):  Avoid Hospitalization  Prognosis:    < 6 months d/t adv dementiai- poor PO intake, no smiling, no walking  Discharge Planning: Hospice facility  Primary Diagnoses: Present on Admission: . UTI (urinary tract infection) . Metabolic encephalopathy . Hypernatremia   I have reviewed the medical record, interviewed the patient and family, and examined the patient. The following aspects are pertinent.  Past Medical History:  Diagnosis Date  .  GERD (gastroesophageal reflux disease)   . Hypercholesteremia   . Renal disorder    Social History   Socioeconomic History  . Marital status: Single    Spouse name: None  . Number of children: None  . Years of education: None  . Highest education level: None  Social Needs  . Financial resource strain: None  . Food insecurity - worry: None  . Food insecurity - inability: None  . Transportation needs - medical: None  . Transportation needs - non-medical: None  Occupational History  . None  Tobacco Use  . Smoking status: Former Research scientist (life sciences)  . Smokeless tobacco: Never Used  Substance and Sexual Activity  . Alcohol use: No  . Drug use: No  . Sexual activity: None  Other Topics Concern  . None  Social History Narrative  . None   History reviewed. No pertinent family history. Scheduled Meds: . aspirin  81 mg Oral BH-q7a  . citalopram  20 mg Oral BH-q7a  . docusate sodium  100 mg Oral BID  . feeding supplement  1 Container Oral BH-q7a  . feeding supplement (ENSURE ENLIVE)  237 mL Oral BID BM  . heparin  5,000 Units Subcutaneous Q8H  . memantine  10 mg Oral BID  . pantoprazole  40 mg Oral Daily   Continuous Infusions: PRN Meds:.acetaminophen **OR** acetaminophen, bisacodyl, hydrALAZINE, ipratropium-albuterol, loratadine, LORazepam, ondansetron **OR** ondansetron (ZOFRAN) IV Medications Prior to Admission:  Prior to Admission medications   Medication Sig Start Date End Date Taking? Authorizing Provider  aspirin 81 MG chewable tablet Chew 81 mg by mouth every morning.   Yes [provider]  atorvastatin (LIPITOR) 10 MG tablet Take 10 mg by mouth every morning.   Yes [provider]  Calcium Carbonate-Vitamin D 600-400 MG-UNIT per tablet Take 1 tablet by mouth 2 (two) times daily.   Yes [provider]  chlorhexidine (PERIDEX) 0.12 % solution Use as directed 15 mLs in the mouth or throat at bedtime.   Yes [provider]  citalopram (CELEXA) 20  MG tablet Take 20 mg by mouth every morning.   Yes [provider]  levofloxacin (LEVAQUIN) 25 MG/ML solution Take 500 mg by mouth daily.   Yes [provider]  memantine (NAMENDA) 10 MG tablet Take 10 mg by mouth 2 (two) times daily.   Yes [provider]  Multiple Vitamins-Minerals (CENTRUM SILVER PO) Take 1 tablet by mouth every morning.   Yes [provider]  polyethylene glycol (MIRALAX / GLYCOLAX) packet Take 17 g by mouth daily.   Yes [provider]  acetaminophen (TYLENOL) 325 MG tablet Take 650 mg by mouth 3 (three) times daily.     [provider]  feeding supplement (BOOST HIGH PROTEIN) LIQD Take 1 Container by mouth every morning.    [provider]  guaifenesin (ROBITUSSIN) 100 MG/5ML syrup Take 200 mg by mouth 3 (three) times daily as needed for cough  or congestion.    [provider]  loperamide (IMODIUM A-D) 2 MG tablet Take 2 mg by mouth every 12 (twelve) hours as needed for diarrhea or loose stools.    [provider]  loratadine (CLARITIN) 10 MG tablet Take 10 mg by mouth daily as needed for allergies.    [provider]  omeprazole (PRILOSEC) 20 MG capsule Take 20 mg by mouth daily as needed (heartburn).    [provider]  pyrithione zinc (HEAD AND SHOULDERS) 1 % shampoo Apply 1 application topically daily as needed for itching.    [provider]  sodium fluoride (DENTAGEL) 1.1 % GEL dental gel Place 1 application onto teeth 2 (two) times daily.    [provider]  triamcinolone cream (KENALOG) 0.1 % Apply 1 application topically 2 (two) times daily. Apply to affected areas of rash at bedtime     [provider]   No Known Allergies Review of Systems  Unable to perform ROS: Dementia    Physical Exam  HENT:  Very HOH  Cardiovascular:  braydcardic  Pulmonary/Chest: Effort normal and breath sounds normal.  Abdominal: Soft.  Musculoskeletal:    Weak, bedbound  Neurological: She is alert.  Answers one question, otherwise nonverbal  Skin: Skin is warm and dry.  Nursing note and vitals reviewed.   Vital Signs: BP 104/81 (BP Location: Right Arm)   Pulse 80   Temp 98.6 F (37 C) (Oral)   Resp 16   Ht 5' 6"  (1.676 m)   Wt 61.3 kg (135 lb 1.6 oz)   SpO2 98%   BMI 21.81 kg/m  Pain Assessment: PAINAD       SpO2: SpO2: 98 % O2 Device:SpO2: 98 % O2 Flow Rate: .   IO: Intake/output summary:   Intake/Output Summary (Last 24 hours) at 05/19/2017 1113 Last data filed at 05/19/2017 1051 Gross per 24 hour  Intake 1485 ml  Output 1050 ml  Net 435 ml    LBM: Last BM Date: (unsure) Baseline Weight: Weight: 68 kg (150 lb) Most recent weight: Weight: 61.3 kg (135 lb 1.6 oz)     Palliative Assessment/Data: PPS: 20%     Thank you for this consult. Palliative medicine will continue to follow and assist as needed.   Time In: 1015 Time Out: 1130 Time Total: 75  minutes Greater than 50%  of this time was spent counseling and coordinating care related to the above assessment and plan.  Signed by: Mariana Kaufman, AGNP-C Palliative Medicine    Please contact Palliative Medicine Team phone at [phone] for questions and concerns.  For individual provider: See Shea Evans

## 2017-05-19 NOTE — Progress Notes (Signed)
New referral for [facility] services at [place] received from [facility] following a Palliative Medicine consult. Patient is an 83 year old woman with past medical history of HOH, CHF, A.fib, CKD, HLD, advanced dementia,  admitted on 05/16/2017 with urosepsis and hypernatremia. During admission she is noted to be nonverbal, with poor po intake. Palliative medicine was consulted for goals of care and met with patient and her DSS guardian today. Per chart note review patient has had the DSS guardian for several years and has no involved family. Plan is for return to [facility] with the support of hospice services. Patient seen lying in bed, alert, nonverbal. No response to any interaction or attempt at conversation. Staff aide alerted to tray at bedside as patient requires feeding. Patient information faxed to referral. Patient to discharge today via EMS. Signed DNR in place in discharge packet. Thank you. Flo Shanks RN, BSN, [facility] [place], hospital Liaison [phone] c

## 2017-05-19 NOTE — Clinical Social Work Note (Addendum)
Patient to be d/c'ed today to [facility].  Patient and family agreeable to plans will transport via ems RN to call report [phone].  Windell MouldingEric Zayvion Stailey, MSW, Theresia MajorsLCSWA [phone]

## 2017-05-21 LAB — CULTURE, BLOOD (ROUTINE X 2)
CULTURE: NO GROWTH
Culture: NO GROWTH
Specimen Description: ADEQUATE

## 2017-05-22 NOTE — Care Management Important Message (Signed)
Important Message  Patient Details  Name: Molly Jimenez MRN: [id number] Date of Birth: [birth year]   Medicare Important Message Given:  Other (see comment)  Received the signed notice from DSS.  Taken to HIM to be scanned  Eber HongGreene, Tag Wurtz R, RN 05/22/2017, 8:49 AM

## 2017-05-26 DIAGNOSIS — Z515 Encounter for palliative care: Secondary | ICD-10-CM

## 2017-05-26 DIAGNOSIS — A419 Sepsis, unspecified organism: Secondary | ICD-10-CM

## 2017-05-26 DIAGNOSIS — Z7189 Other specified counseling: Secondary | ICD-10-CM

## 2017-06-13 DEATH — deceased
